# Patient Record
Sex: Female | Born: 1955 | Hispanic: No | Marital: Married | Smoking: Never smoker
Health system: Southern US, Community
[De-identification: ages and names within clinical notes are randomized; demographics above are authoritative.]

## PROBLEM LIST (undated history)

## (undated) DIAGNOSIS — I8390 Asymptomatic varicose veins of unspecified lower extremity: Secondary | ICD-10-CM

## (undated) DIAGNOSIS — R918 Other nonspecific abnormal finding of lung field: Secondary | ICD-10-CM

## (undated) DIAGNOSIS — E785 Hyperlipidemia, unspecified: Secondary | ICD-10-CM

## (undated) DIAGNOSIS — M549 Dorsalgia, unspecified: Secondary | ICD-10-CM

## (undated) DIAGNOSIS — I2510 Atherosclerotic heart disease of native coronary artery without angina pectoris: Secondary | ICD-10-CM

## (undated) HISTORY — DX: Asymptomatic varicose veins of unspecified lower extremity: I83.90

## (undated) HISTORY — PX: OTHER SURGICAL HISTORY: SHX169

## (undated) HISTORY — DX: Dorsalgia, unspecified: M54.9

## (undated) HISTORY — DX: Atherosclerotic heart disease of native coronary artery without angina pectoris: I25.10

## (undated) HISTORY — DX: Other nonspecific abnormal finding of lung field: R91.8

## (undated) HISTORY — DX: Hyperlipidemia, unspecified: E78.5

## (undated) HISTORY — PX: EYE SURGERY: SHX253

---

## 1999-06-16 ENCOUNTER — Other Ambulatory Visit: Admission: RE | Admit: 1999-06-16 | Discharge: 1999-06-16 | Payer: Self-pay | Admitting: Family Medicine

## 2004-12-22 ENCOUNTER — Other Ambulatory Visit: Admission: RE | Admit: 2004-12-22 | Discharge: 2004-12-22 | Payer: Self-pay | Admitting: Family Medicine

## 2005-12-29 ENCOUNTER — Other Ambulatory Visit: Admission: RE | Admit: 2005-12-29 | Discharge: 2005-12-29 | Payer: Self-pay | Admitting: Family Medicine

## 2007-09-07 ENCOUNTER — Other Ambulatory Visit: Admission: RE | Admit: 2007-09-07 | Discharge: 2007-09-07 | Payer: Self-pay | Admitting: Family Medicine

## 2008-01-05 ENCOUNTER — Ambulatory Visit: Payer: Self-pay | Admitting: Oncology

## 2010-09-23 ENCOUNTER — Other Ambulatory Visit: Admission: RE | Admit: 2010-09-23 | Discharge: 2010-09-23 | Payer: Self-pay | Admitting: Family Medicine

## 2015-06-06 ENCOUNTER — Other Ambulatory Visit (HOSPITAL_COMMUNITY)
Admission: RE | Admit: 2015-06-06 | Discharge: 2015-06-06 | Disposition: A | Discharge status: Home / Self Care | Payer: BLUE CROSS/BLUE SHIELD | Source: Ambulatory Visit | Attending: Physician Assistant | Admitting: Physician Assistant

## 2015-06-06 ENCOUNTER — Other Ambulatory Visit: Payer: Self-pay | Admitting: Physician Assistant

## 2015-06-06 DIAGNOSIS — Z1151 Encounter for screening for human papillomavirus (HPV): Secondary | ICD-10-CM | POA: Diagnosis present

## 2015-06-06 DIAGNOSIS — Z01419 Encounter for gynecological examination (general) (routine) without abnormal findings: Secondary | ICD-10-CM | POA: Insufficient documentation

## 2015-06-09 LAB — CYTOLOGY - PAP

## 2015-12-16 ENCOUNTER — Other Ambulatory Visit: Payer: Self-pay | Admitting: Family Medicine

## 2015-12-16 ENCOUNTER — Ambulatory Visit
Admission: RE | Admit: 2015-12-16 | Discharge: 2015-12-16 | Disposition: A | Discharge status: Home / Self Care | Payer: BLUE CROSS/BLUE SHIELD | Source: Ambulatory Visit | Attending: Family Medicine | Admitting: Family Medicine

## 2015-12-16 DIAGNOSIS — S39012A Strain of muscle, fascia and tendon of lower back, initial encounter: Secondary | ICD-10-CM

## 2016-01-08 ENCOUNTER — Other Ambulatory Visit: Payer: Self-pay | Admitting: Family Medicine

## 2016-01-08 DIAGNOSIS — N289 Disorder of kidney and ureter, unspecified: Secondary | ICD-10-CM

## 2016-01-13 ENCOUNTER — Ambulatory Visit
Admission: RE | Admit: 2016-01-13 | Discharge: 2016-01-13 | Disposition: A | Discharge status: Home / Self Care | Payer: BLUE CROSS/BLUE SHIELD | Source: Ambulatory Visit | Attending: Family Medicine | Admitting: Family Medicine

## 2016-01-13 DIAGNOSIS — N289 Disorder of kidney and ureter, unspecified: Secondary | ICD-10-CM

## 2016-06-07 DIAGNOSIS — Z0000 Encounter for general adult medical examination without abnormal findings: Secondary | ICD-10-CM | POA: Diagnosis not present

## 2016-06-07 DIAGNOSIS — E785 Hyperlipidemia, unspecified: Secondary | ICD-10-CM | POA: Diagnosis not present

## 2016-06-07 DIAGNOSIS — I8390 Asymptomatic varicose veins of unspecified lower extremity: Secondary | ICD-10-CM | POA: Diagnosis not present

## 2016-06-10 ENCOUNTER — Other Ambulatory Visit: Payer: Self-pay | Admitting: *Deleted

## 2016-06-10 ENCOUNTER — Encounter: Payer: Self-pay | Admitting: Vascular Surgery

## 2016-06-10 DIAGNOSIS — I83892 Varicose veins of left lower extremities with other complications: Secondary | ICD-10-CM

## 2016-07-16 IMAGING — CR DG LUMBAR SPINE 2-3V
3 series · 3 of 3 positions shown · non-contrast
Comparison: None.

CLINICAL DATA: Chronic lumbago, with recent exacerbation

EXAM:
LUMBAR SPINE - 2-3 VIEW

[w lumbar spine ap]
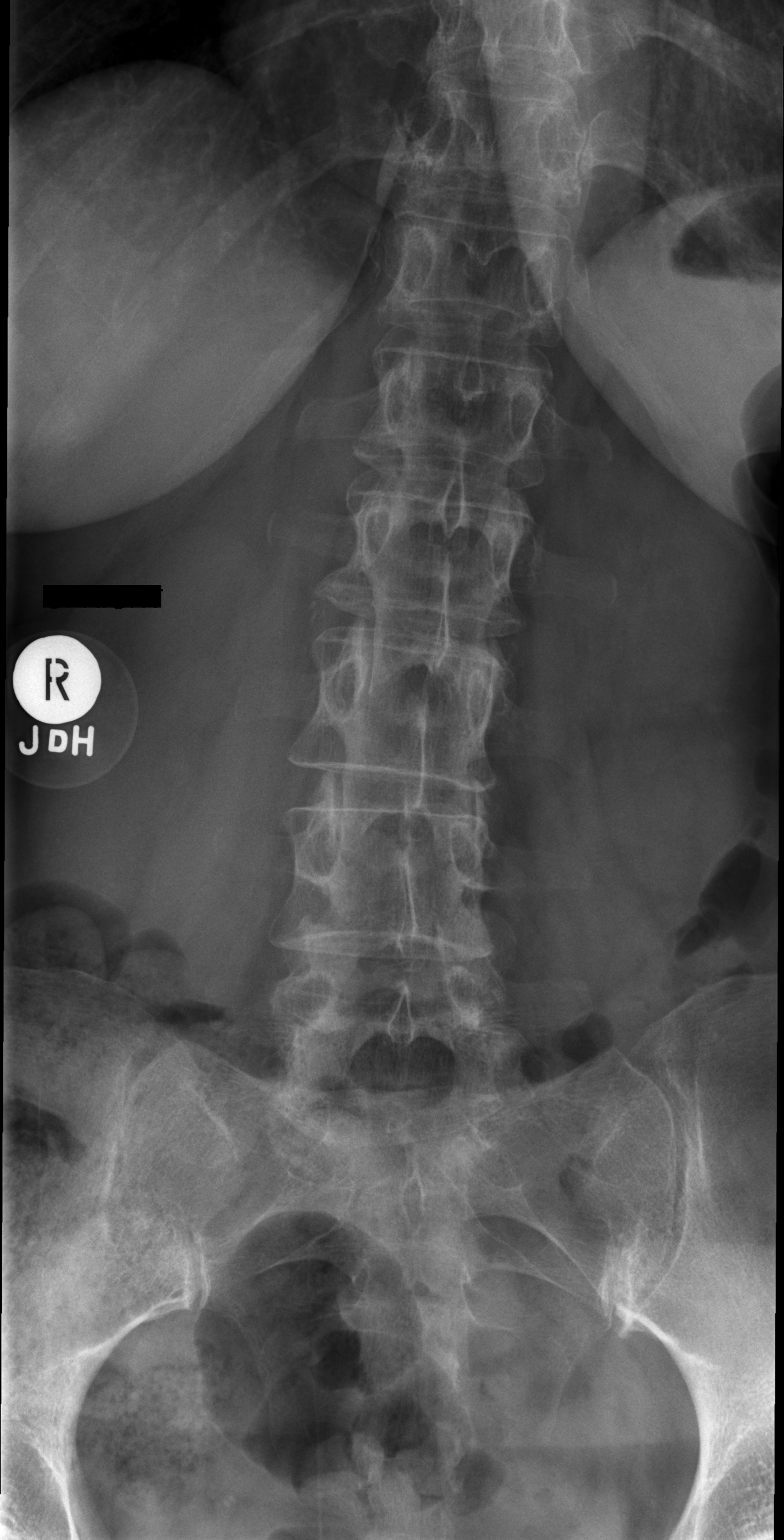

[w lumbar spine lat]
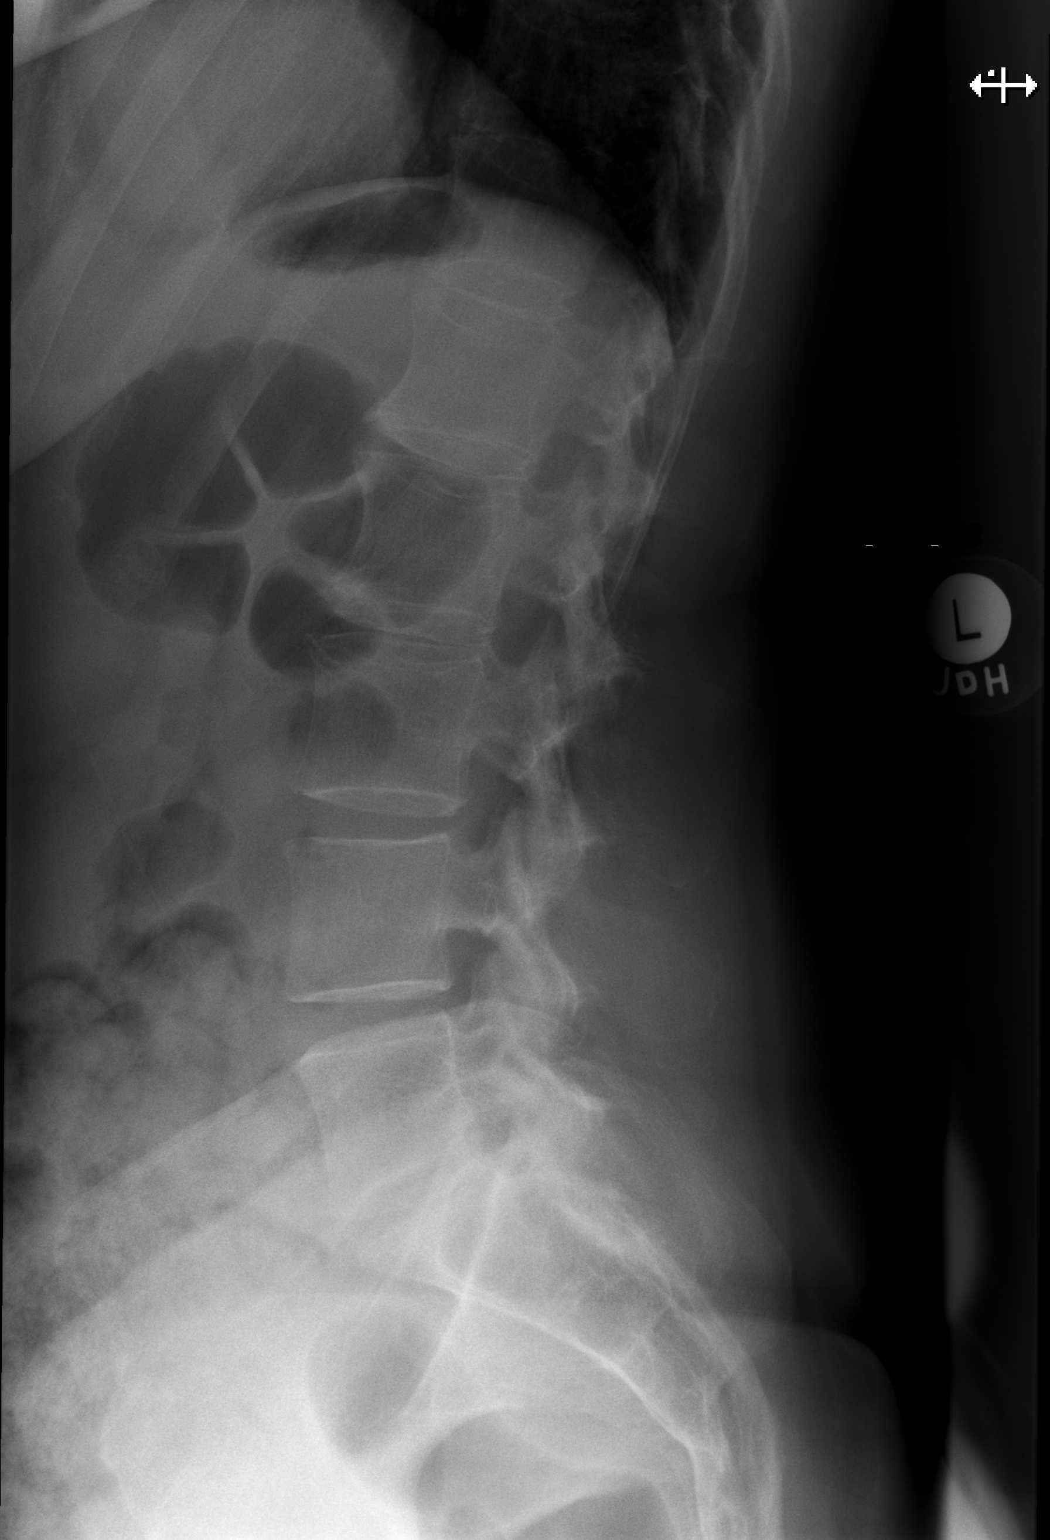

[w lumbar l-5 s-1 spot]
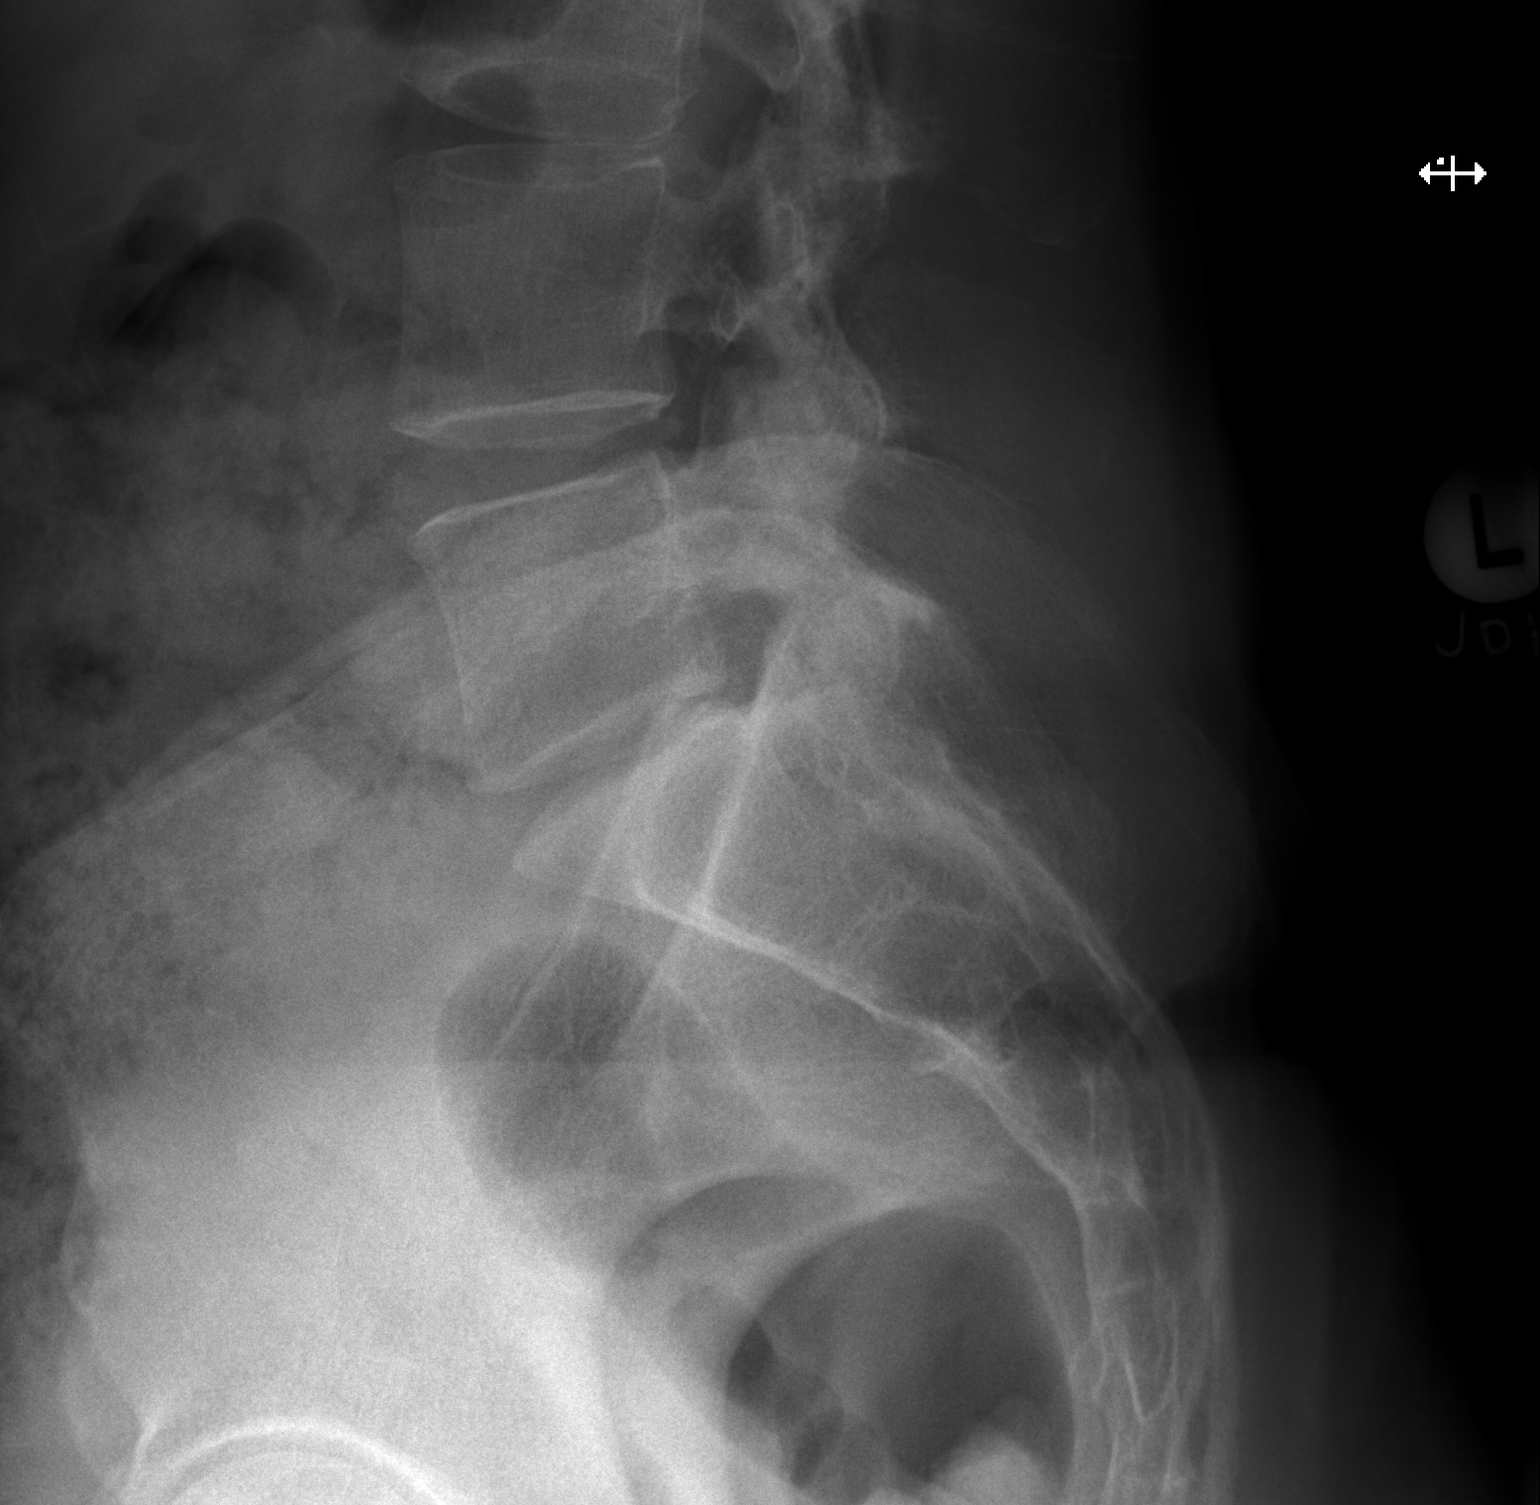

[3 of 3 positions shown; findings below may reference images not displayed]

FINDINGS: Upright frontal, upright lateral, and spot lumbosacral lateral
images were obtained. There are 5 non-rib-bearing lumbar type
vertebral bodies. There is thoracolumbar dextroscoliosis. There is
no fracture or spondylolisthesis. The disc spaces appear
unremarkable. No erosive change.
IMPRESSION: Scoliosis. No fracture or spondylolisthesis. No appreciable
arthropathic change.

## 2016-07-20 ENCOUNTER — Encounter: Payer: Self-pay | Admitting: Vascular Surgery

## 2016-07-27 ENCOUNTER — Encounter: Payer: Self-pay | Admitting: Vascular Surgery

## 2016-07-27 ENCOUNTER — Ambulatory Visit (INDEPENDENT_AMBULATORY_CARE_PROVIDER_SITE_OTHER): Payer: BLUE CROSS/BLUE SHIELD | Admitting: Vascular Surgery

## 2016-07-27 ENCOUNTER — Ambulatory Visit (HOSPITAL_COMMUNITY)
Admission: RE | Admit: 2016-07-27 | Discharge: 2016-07-27 | Disposition: A | Discharge status: Home / Self Care | Payer: BLUE CROSS/BLUE SHIELD | Source: Ambulatory Visit | Attending: Vascular Surgery | Admitting: Vascular Surgery

## 2016-07-27 VITALS — BP 136/76 | HR 54 | Temp 97.4°F | Resp 18 | Ht 66.75 in | Wt 161.3 lb

## 2016-07-27 DIAGNOSIS — I83892 Varicose veins of left lower extremities with other complications: Secondary | ICD-10-CM | POA: Diagnosis not present

## 2016-07-27 NOTE — Progress Notes (Signed)
Patient name: Rebekah PapasLinda S Schultz MRN: [id number] DOB: 11/01/1956 Sex: female  REASON FOR CONSULT: Evaluation of the venous hypertension left leg  HPI: Rebekah PapasLinda S Schultz is a 60 y.o. female, seen today for discussion of venous hypertension. 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 She has had episodes over the past several years where she's developed spontaneous bleeding under the skin in the area of a plexus of varicosities in the medial aspect of her proximal calf. She has never had any external hemorrhage. She does report mild discomfort and mild swelling associated with this. Reports that she has had varicosities for many years. She does have some mild discomfort with prolonged standing but this is tolerable. No history of DVT. Has a strong family history for arterial occlusive disease in her lower extremities was concern regarding. Did have history of varicosities in her mother.  Past Medical History:  Diagnosis Date  . Back pain   . Hyperlipidemia   . Varicose veins     Family History  Problem Relation Age of Onset  . Cancer Mother   . Diabetes Father   . Cancer Father   . Diabetes Sister   . Hyperlipidemia Sister   . Cancer Brother   . Diabetes Brother   . Hypertension Brother   . Cancer Maternal Grandmother   . Diabetes Paternal Grandmother   . Heart disease Paternal Grandmother   . Diabetes Paternal Grandfather   . Cancer Maternal Uncle     SOCIAL HISTORY: Social History   Social History  . Marital status: Married    Spouse name: N/A  . Number of children: N/A  . Years of education: N/A   Occupational History  . Not on file.   Social History Main Topics  . Smoking status: Never Smoker  . Smokeless tobacco: Not on file  . Alcohol use Yes     Comment: rare  . Drug use: No  . Sexual activity: Not on file   Other Topics Concern  . Not on file   Social History Narrative  . No narrative on file    Allergies  Allergen  Reactions  . Codeine     Severe nausea    Current Outpatient Prescriptions  Medication Sig Dispense Refill  . Calcium Carb-Cholecalciferol (CALCIUM + D3) 600-200 MG-UNIT TABS Take by mouth daily.    . Multiple Vitamin (MULTIVITAMIN) tablet Take 1 tablet by mouth daily.    . naproxen sodium (ANAPROX) 220 MG tablet Take 220 mg by mouth daily.      No current facility-administered medications for this visit.     REVIEW OF SYSTEMS:  [X]  denotes positive finding, [ ]  denotes negative finding Cardiac  Comments:  Chest pain or chest pressure:    Shortness of breath upon exertion:    Short of breath when lying flat:    Irregular heart rhythm:        Vascular    Pain in calf, thigh, or hip brought on by ambulation:    Pain in feet at night that wakes you up from your sleep:     Blood clot in your veins:    Leg swelling:         Pulmonary    Oxygen at home:    Productive cough:     Wheezing:         Neurologic    Sudden weakness in arms or legs:     Sudden numbness in arms or  legs:     Sudden onset of difficulty speaking or slurred speech:    Temporary loss of vision in one eye:     Problems with dizziness:         Gastrointestinal    Blood in stool:     Vomited blood:         Genitourinary    Burning when urinating:     Blood in urine:        Psychiatric    Major depression:         Hematologic    Bleeding problems:    Problems with blood clotting too easily:        Skin    Rashes or ulcers:        Constitutional    Fever or chills:      PHYSICAL EXAM: Vitals:   07/27/16 0953  BP: 136/76  Pulse: (!) 54  Resp: 18  Temp: 97.4 F (36.3 C)  TempSrc: Oral  SpO2: 100%  Weight: 161 lb 4.8 oz (73.2 kg)  Height: 5' 6.75" (1.695 m)    GENERAL: The patient is a well-nourished female, in no acute distress. The vital signs are documented above. CARDIAC: There is a regular rate and rhythm.  VASCULAR: 2+ radial 2+ dorsalis pedis pulses bilaterally. Carotid  arteries without bruits bilaterally PULMONARY: There is good air exchange bilaterally without wheezing or rales. ABDOMEN: Soft and non-tender with normal pitched bowel sounds.  MUSCULOSKELETAL: There are no major deformities or cyanosis. NEUROLOGIC: No focal weakness or paresthesias are detected. SKIN: There are no ulcers or rashes noted. PSYCHIATRIC: The patient has a normal affect. She does have a plexus of varicosities in the medial aspect of her left calf. Also has varicosities in her right thigh extending from the medial groin area across her anterior thigh and then down her lateral calf.  DATA:   She underwent left leg duplex today. This does show reflux in her deep and superficial system. I imaged these areas with SonoSite ultrasound as well. The reflux from the great saphenous vein does extend into these varicosities in her medial calf.  MEDICAL ISSUES:  Discuss the significance of this at length with patient. Explained that this is not pose any danger or increased risk for DVT. Explained that this may be progressive. She currently is able to tolerate the discomfort associated with these swelling in her varicosities. I she has had multiple episodes of subcutaneous hemorrhage related to the pressure in these but this is currently tolerable for her. Explained that the pattern on her left leg is more compatible with reflux and anterior accessory branch. She is having no discomfort with this. She was relieved with our discussion. I explained that if she does develop worsening pain the more concern regarding that she could contact us at any time for further evaluation and possible treatment.   Gretta Began [facility] [phone]

## 2016-08-04 ENCOUNTER — Encounter: Payer: Self-pay | Admitting: Family Medicine

## 2016-08-13 IMAGING — US US RENAL
1 series · 14 of 25 positions shown · non-contrast
Comparison: None.

CLINICAL DATA: Follow-up renal lesions seen on MRI

EXAM:
RENAL / URINARY TRACT ULTRASOUND COMPLETE

[Series 1: us renal · 0.21mm/px · 14 of 35 slices shown]
[im 1/35]
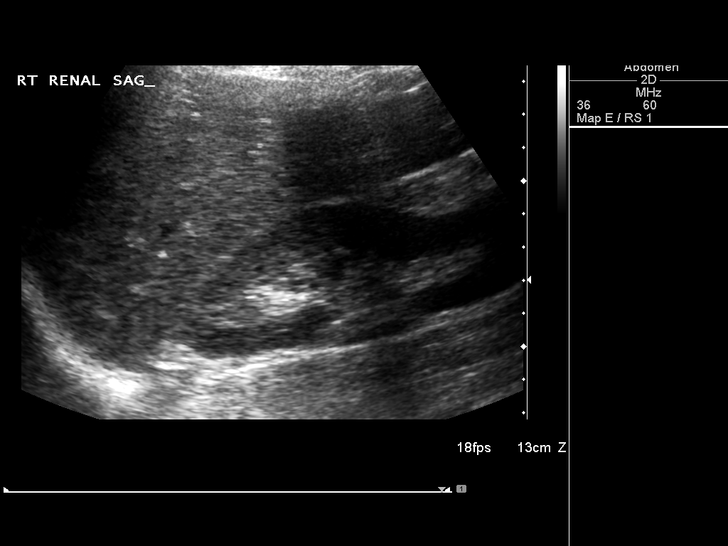
[im 3/35]
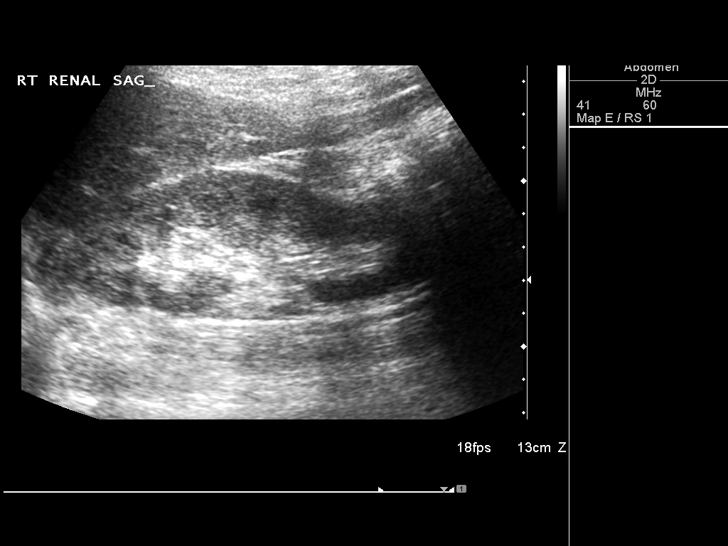
[im 6/35]
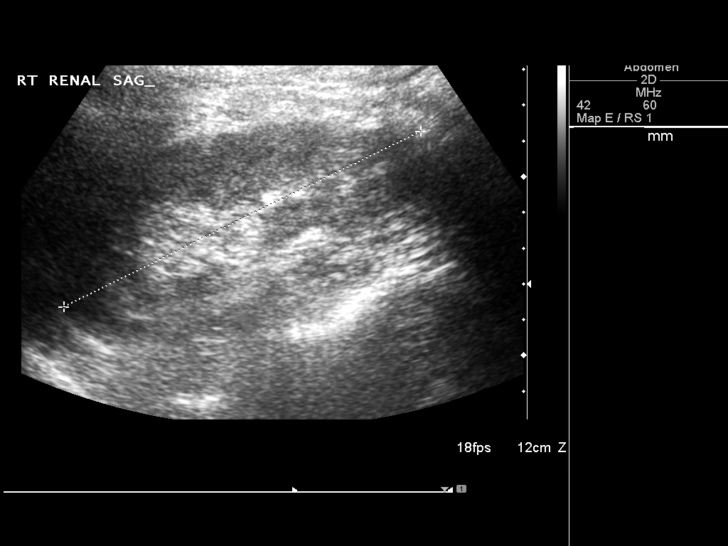
[im 9/35]
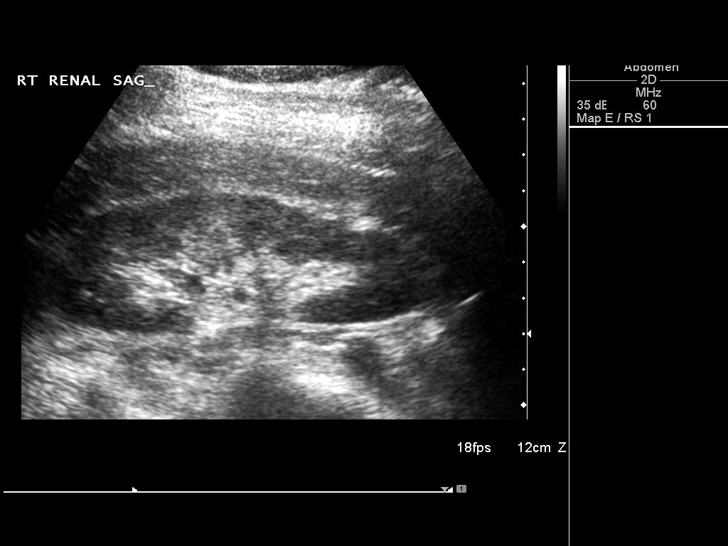
[im 12/35]
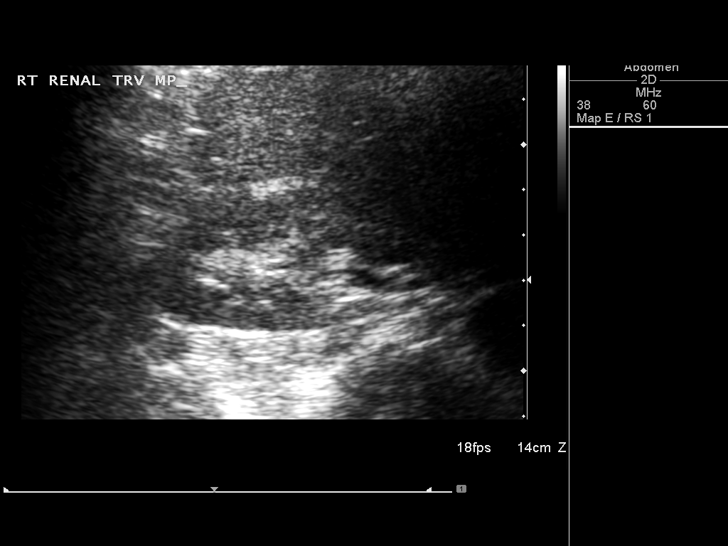
[im 13/35]
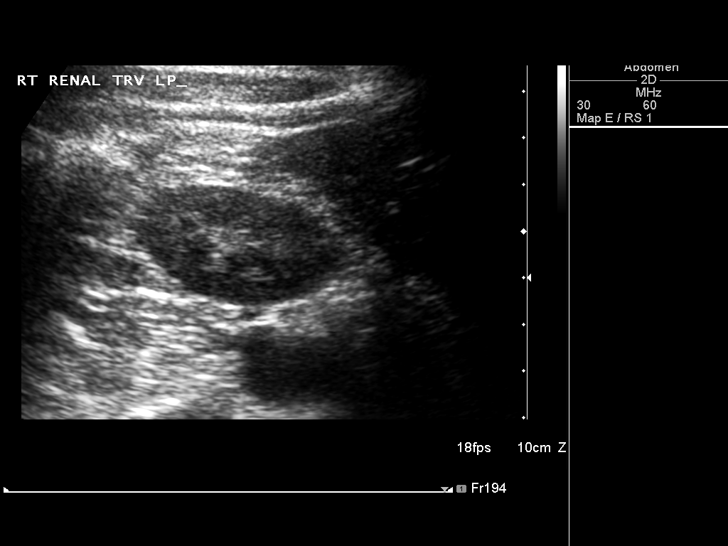
[im 16/35]
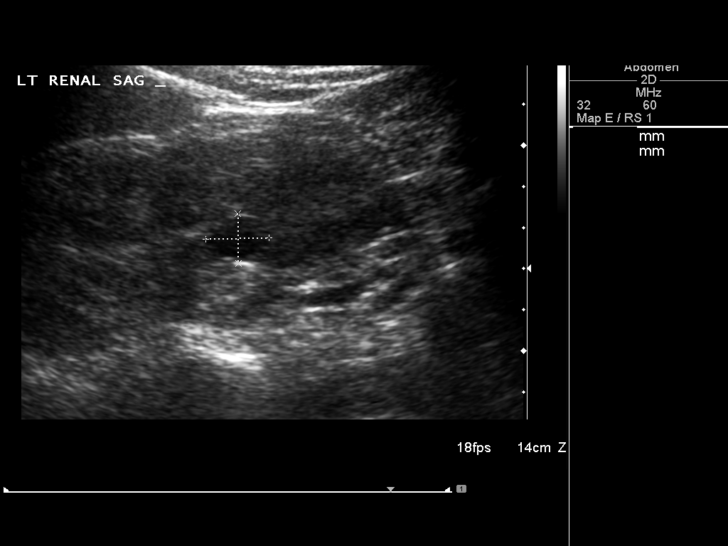
[im 19/35]
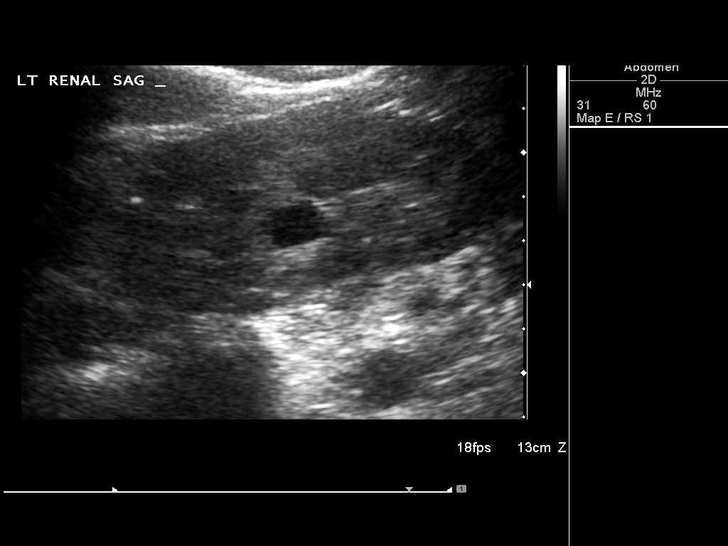
[im 22/35]
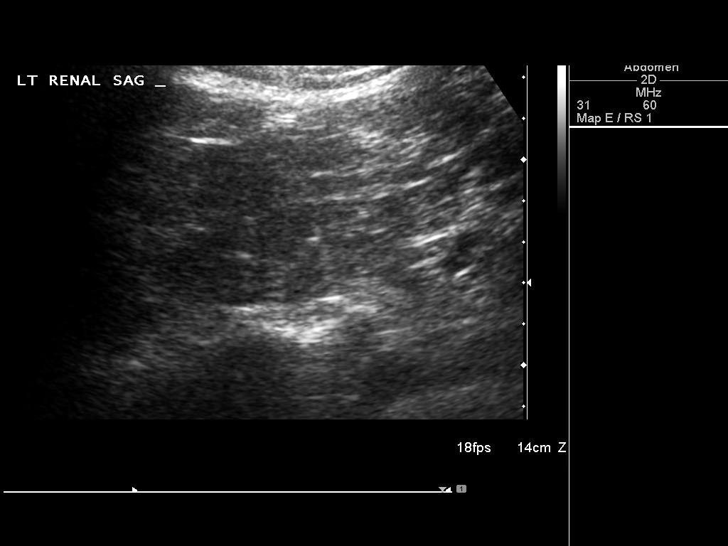
[im 23/35]
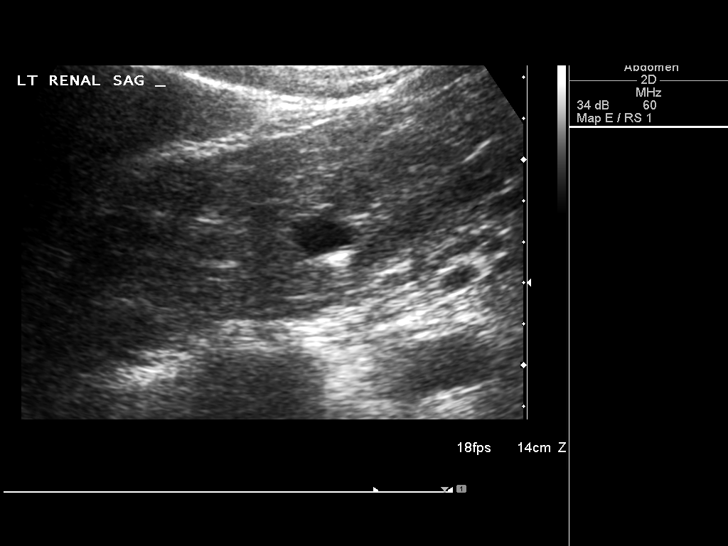
[im 26/35]
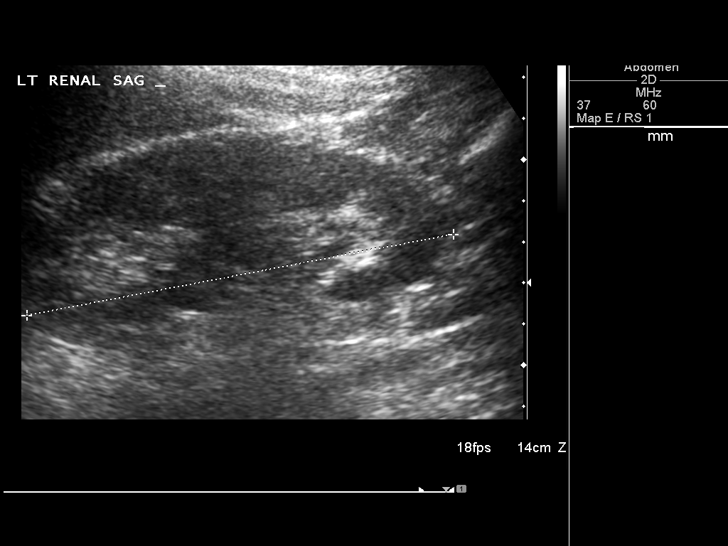
[im 29/35]
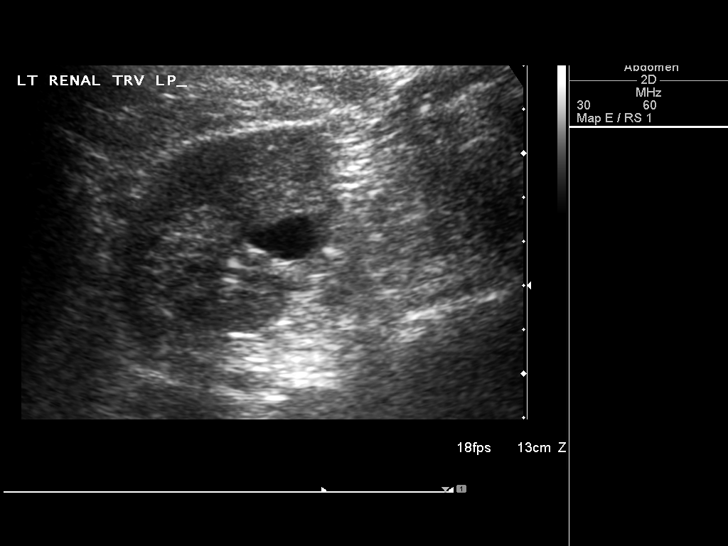
[im 32/35]
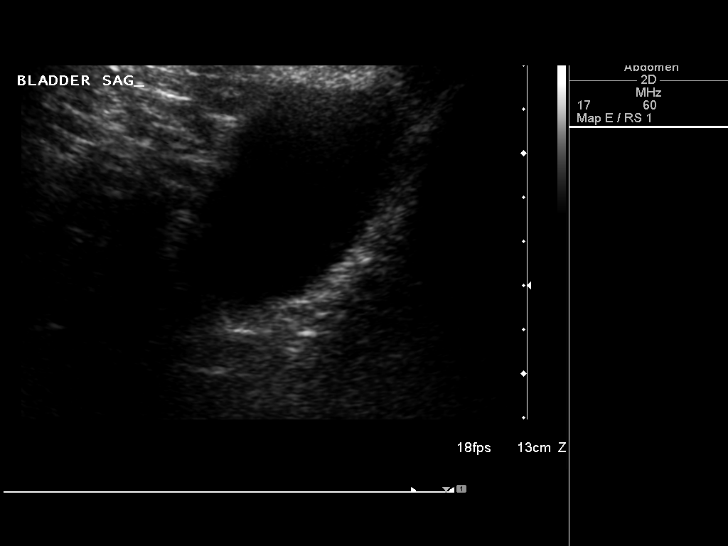
[im 35/35]
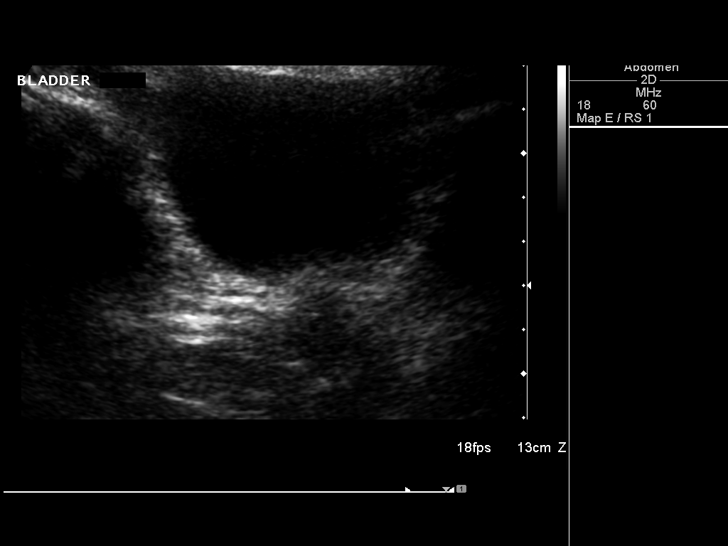

[14 of 25 positions shown; findings below may reference images not displayed]

FINDINGS: Right Kidney:

Length: 11.3 cm. Echogenicity within normal limits. No mass or
hydronephrosis visualized.

Left Kidney:

Length: 10.6 cm. Echogenicity within normal limits. No mass or
hydronephrosis visualized. Lower pole cyst measures up to 1.5 cm and
appears benign.

Bladder:

Appears normal for degree of bladder distention.
IMPRESSION: No acute findings.  No hydronephrosis.

Small benign-appearing cyst in the mid to lower pole of the left
kidney.

## 2016-09-13 DIAGNOSIS — Z1231 Encounter for screening mammogram for malignant neoplasm of breast: Secondary | ICD-10-CM | POA: Diagnosis not present

## 2016-12-03 DIAGNOSIS — H5213 Myopia, bilateral: Secondary | ICD-10-CM | POA: Diagnosis not present

## 2017-05-06 DIAGNOSIS — J069 Acute upper respiratory infection, unspecified: Secondary | ICD-10-CM | POA: Diagnosis not present

## 2017-07-26 DIAGNOSIS — E785 Hyperlipidemia, unspecified: Secondary | ICD-10-CM | POA: Diagnosis not present

## 2017-07-26 DIAGNOSIS — Z0000 Encounter for general adult medical examination without abnormal findings: Secondary | ICD-10-CM | POA: Diagnosis not present

## 2017-07-26 DIAGNOSIS — Z23 Encounter for immunization: Secondary | ICD-10-CM | POA: Diagnosis not present

## 2017-09-07 DIAGNOSIS — E2839 Other primary ovarian failure: Secondary | ICD-10-CM | POA: Diagnosis not present

## 2017-09-07 DIAGNOSIS — M8588 Other specified disorders of bone density and structure, other site: Secondary | ICD-10-CM | POA: Diagnosis not present

## 2017-09-14 DIAGNOSIS — M859 Disorder of bone density and structure, unspecified: Secondary | ICD-10-CM | POA: Diagnosis not present

## 2018-08-17 ENCOUNTER — Other Ambulatory Visit (HOSPITAL_COMMUNITY)
Admission: RE | Admit: 2018-08-17 | Discharge: 2018-08-17 | Disposition: A | Discharge status: Home / Self Care | Payer: BLUE CROSS/BLUE SHIELD | Source: Ambulatory Visit | Attending: Family Medicine | Admitting: Family Medicine

## 2018-08-17 ENCOUNTER — Other Ambulatory Visit: Payer: Self-pay | Admitting: Family Medicine

## 2018-08-17 DIAGNOSIS — E785 Hyperlipidemia, unspecified: Secondary | ICD-10-CM | POA: Diagnosis not present

## 2018-08-17 DIAGNOSIS — Z124 Encounter for screening for malignant neoplasm of cervix: Secondary | ICD-10-CM | POA: Insufficient documentation

## 2018-08-17 DIAGNOSIS — Z0000 Encounter for general adult medical examination without abnormal findings: Secondary | ICD-10-CM | POA: Diagnosis not present

## 2018-08-17 DIAGNOSIS — Z23 Encounter for immunization: Secondary | ICD-10-CM | POA: Diagnosis not present

## 2018-08-21 LAB — CYTOLOGY - PAP
DIAGNOSIS: NEGATIVE
HPV (WINDOPATH): NOT DETECTED

## 2018-09-13 DIAGNOSIS — Z01818 Encounter for other preprocedural examination: Secondary | ICD-10-CM | POA: Diagnosis not present

## 2018-09-19 DIAGNOSIS — Z1211 Encounter for screening for malignant neoplasm of colon: Secondary | ICD-10-CM | POA: Diagnosis not present

## 2018-09-19 DIAGNOSIS — K649 Unspecified hemorrhoids: Secondary | ICD-10-CM | POA: Diagnosis not present

## 2018-09-19 DIAGNOSIS — K5730 Diverticulosis of large intestine without perforation or abscess without bleeding: Secondary | ICD-10-CM | POA: Diagnosis not present

## 2018-10-05 DIAGNOSIS — J0140 Acute pansinusitis, unspecified: Secondary | ICD-10-CM | POA: Diagnosis not present

## 2018-11-18 DIAGNOSIS — R05 Cough: Secondary | ICD-10-CM | POA: Diagnosis not present

## 2019-05-23 DIAGNOSIS — H5213 Myopia, bilateral: Secondary | ICD-10-CM | POA: Diagnosis not present

## 2019-07-11 DIAGNOSIS — D485 Neoplasm of uncertain behavior of skin: Secondary | ICD-10-CM | POA: Diagnosis not present

## 2019-07-11 DIAGNOSIS — D1801 Hemangioma of skin and subcutaneous tissue: Secondary | ICD-10-CM | POA: Diagnosis not present

## 2019-07-11 DIAGNOSIS — L821 Other seborrheic keratosis: Secondary | ICD-10-CM | POA: Diagnosis not present

## 2019-07-11 DIAGNOSIS — L570 Actinic keratosis: Secondary | ICD-10-CM | POA: Diagnosis not present

## 2019-07-11 DIAGNOSIS — L578 Other skin changes due to chronic exposure to nonionizing radiation: Secondary | ICD-10-CM | POA: Diagnosis not present

## 2019-12-11 DIAGNOSIS — Z20828 Contact with and (suspected) exposure to other viral communicable diseases: Secondary | ICD-10-CM | POA: Diagnosis not present

## 2020-01-23 DIAGNOSIS — Z1231 Encounter for screening mammogram for malignant neoplasm of breast: Secondary | ICD-10-CM | POA: Diagnosis not present

## 2020-03-25 DIAGNOSIS — Z0000 Encounter for general adult medical examination without abnormal findings: Secondary | ICD-10-CM | POA: Diagnosis not present

## 2020-03-25 DIAGNOSIS — Z23 Encounter for immunization: Secondary | ICD-10-CM | POA: Diagnosis not present

## 2020-03-25 DIAGNOSIS — E785 Hyperlipidemia, unspecified: Secondary | ICD-10-CM | POA: Diagnosis not present

## 2020-04-22 DIAGNOSIS — H5213 Myopia, bilateral: Secondary | ICD-10-CM | POA: Diagnosis not present

## 2020-11-19 DIAGNOSIS — Z20822 Contact with and (suspected) exposure to covid-19: Secondary | ICD-10-CM | POA: Diagnosis not present

## 2022-01-25 DIAGNOSIS — J300 Vasomotor rhinitis: Secondary | ICD-10-CM | POA: Diagnosis not present

## 2022-01-25 DIAGNOSIS — R059 Cough, unspecified: Secondary | ICD-10-CM | POA: Diagnosis not present

## 2022-01-25 DIAGNOSIS — H1045 Other chronic allergic conjunctivitis: Secondary | ICD-10-CM | POA: Diagnosis not present

## 2022-05-18 DIAGNOSIS — Z1331 Encounter for screening for depression: Secondary | ICD-10-CM | POA: Diagnosis not present

## 2022-05-18 DIAGNOSIS — E785 Hyperlipidemia, unspecified: Secondary | ICD-10-CM | POA: Diagnosis not present

## 2022-05-18 DIAGNOSIS — Z0000 Encounter for general adult medical examination without abnormal findings: Secondary | ICD-10-CM | POA: Diagnosis not present

## 2022-05-18 DIAGNOSIS — Z23 Encounter for immunization: Secondary | ICD-10-CM | POA: Diagnosis not present

## 2022-08-10 DIAGNOSIS — H2513 Age-related nuclear cataract, bilateral: Secondary | ICD-10-CM | POA: Diagnosis not present

## 2022-09-29 DIAGNOSIS — J300 Vasomotor rhinitis: Secondary | ICD-10-CM | POA: Diagnosis not present

## 2022-09-29 DIAGNOSIS — R058 Other specified cough: Secondary | ICD-10-CM | POA: Diagnosis not present

## 2022-09-29 DIAGNOSIS — H1045 Other chronic allergic conjunctivitis: Secondary | ICD-10-CM | POA: Diagnosis not present

## 2022-12-01 DIAGNOSIS — Z1231 Encounter for screening mammogram for malignant neoplasm of breast: Secondary | ICD-10-CM | POA: Diagnosis not present

## 2022-12-08 DIAGNOSIS — R922 Inconclusive mammogram: Secondary | ICD-10-CM | POA: Diagnosis not present

## 2023-06-14 DIAGNOSIS — Z0000 Encounter for general adult medical examination without abnormal findings: Secondary | ICD-10-CM | POA: Diagnosis not present

## 2023-06-14 DIAGNOSIS — Z124 Encounter for screening for malignant neoplasm of cervix: Secondary | ICD-10-CM | POA: Diagnosis not present

## 2023-06-14 DIAGNOSIS — Z1331 Encounter for screening for depression: Secondary | ICD-10-CM | POA: Diagnosis not present

## 2023-06-14 DIAGNOSIS — E785 Hyperlipidemia, unspecified: Secondary | ICD-10-CM | POA: Diagnosis not present

## 2023-08-08 DIAGNOSIS — H1045 Other chronic allergic conjunctivitis: Secondary | ICD-10-CM | POA: Diagnosis not present

## 2023-08-08 DIAGNOSIS — R058 Other specified cough: Secondary | ICD-10-CM | POA: Diagnosis not present

## 2023-08-08 DIAGNOSIS — J300 Vasomotor rhinitis: Secondary | ICD-10-CM | POA: Diagnosis not present

## 2023-08-25 DIAGNOSIS — Z23 Encounter for immunization: Secondary | ICD-10-CM | POA: Diagnosis not present

## 2023-08-25 DIAGNOSIS — J300 Vasomotor rhinitis: Secondary | ICD-10-CM | POA: Diagnosis not present

## 2023-08-25 DIAGNOSIS — Z6824 Body mass index (BMI) 24.0-24.9, adult: Secondary | ICD-10-CM | POA: Diagnosis not present

## 2023-10-03 DIAGNOSIS — H2511 Age-related nuclear cataract, right eye: Secondary | ICD-10-CM | POA: Diagnosis not present

## 2023-11-21 DIAGNOSIS — N390 Urinary tract infection, site not specified: Secondary | ICD-10-CM | POA: Diagnosis not present

## 2023-11-22 ENCOUNTER — Other Ambulatory Visit: Payer: Self-pay | Admitting: Nurse Practitioner

## 2023-11-22 ENCOUNTER — Ambulatory Visit
Admission: RE | Admit: 2023-11-22 | Discharge: 2023-11-22 | Disposition: A | Discharge status: Home / Self Care | Payer: Medicare Other | Source: Ambulatory Visit | Attending: Nurse Practitioner | Admitting: Nurse Practitioner

## 2023-11-22 DIAGNOSIS — R93422 Abnormal radiologic findings on diagnostic imaging of left kidney: Secondary | ICD-10-CM | POA: Diagnosis not present

## 2023-11-22 DIAGNOSIS — R109 Unspecified abdominal pain: Secondary | ICD-10-CM

## 2023-11-22 DIAGNOSIS — Z1389 Encounter for screening for other disorder: Secondary | ICD-10-CM | POA: Diagnosis not present

## 2023-11-22 DIAGNOSIS — K5790 Diverticulosis of intestine, part unspecified, without perforation or abscess without bleeding: Secondary | ICD-10-CM | POA: Diagnosis not present

## 2023-11-22 DIAGNOSIS — K5730 Diverticulosis of large intestine without perforation or abscess without bleeding: Secondary | ICD-10-CM | POA: Diagnosis not present

## 2023-11-28 ENCOUNTER — Other Ambulatory Visit: Payer: Self-pay | Admitting: Nurse Practitioner

## 2023-11-28 DIAGNOSIS — R918 Other nonspecific abnormal finding of lung field: Secondary | ICD-10-CM

## 2023-12-01 ENCOUNTER — Ambulatory Visit
Admission: RE | Admit: 2023-12-01 | Discharge: 2023-12-01 | Disposition: A | Discharge status: Home / Self Care | Payer: Medicare Other | Source: Ambulatory Visit | Attending: Nurse Practitioner | Admitting: Nurse Practitioner

## 2023-12-01 DIAGNOSIS — R918 Other nonspecific abnormal finding of lung field: Secondary | ICD-10-CM

## 2023-12-05 DIAGNOSIS — N130 Hydronephrosis with ureteropelvic junction obstruction: Secondary | ICD-10-CM | POA: Diagnosis not present

## 2023-12-05 DIAGNOSIS — R3121 Asymptomatic microscopic hematuria: Secondary | ICD-10-CM | POA: Diagnosis not present

## 2023-12-07 DIAGNOSIS — Z1231 Encounter for screening mammogram for malignant neoplasm of breast: Secondary | ICD-10-CM | POA: Diagnosis not present

## 2024-01-03 DIAGNOSIS — N130 Hydronephrosis with ureteropelvic junction obstruction: Secondary | ICD-10-CM | POA: Diagnosis not present

## 2024-01-03 DIAGNOSIS — K5730 Diverticulosis of large intestine without perforation or abscess without bleeding: Secondary | ICD-10-CM | POA: Diagnosis not present

## 2024-01-03 DIAGNOSIS — N2889 Other specified disorders of kidney and ureter: Secondary | ICD-10-CM | POA: Diagnosis not present

## 2024-01-03 DIAGNOSIS — N281 Cyst of kidney, acquired: Secondary | ICD-10-CM | POA: Diagnosis not present

## 2024-01-03 DIAGNOSIS — R3121 Asymptomatic microscopic hematuria: Secondary | ICD-10-CM | POA: Diagnosis not present

## 2024-01-03 DIAGNOSIS — N200 Calculus of kidney: Secondary | ICD-10-CM | POA: Diagnosis not present

## 2024-02-07 DIAGNOSIS — J069 Acute upper respiratory infection, unspecified: Secondary | ICD-10-CM | POA: Diagnosis not present

## 2024-02-08 DIAGNOSIS — D225 Melanocytic nevi of trunk: Secondary | ICD-10-CM | POA: Diagnosis not present

## 2024-02-08 DIAGNOSIS — L578 Other skin changes due to chronic exposure to nonionizing radiation: Secondary | ICD-10-CM | POA: Diagnosis not present

## 2024-02-08 DIAGNOSIS — B079 Viral wart, unspecified: Secondary | ICD-10-CM | POA: Diagnosis not present

## 2024-02-08 DIAGNOSIS — L821 Other seborrheic keratosis: Secondary | ICD-10-CM | POA: Diagnosis not present

## 2024-02-14 ENCOUNTER — Ambulatory Visit: Payer: Medicare Other | Admitting: Acute Care

## 2024-02-14 ENCOUNTER — Encounter: Payer: Self-pay | Admitting: Acute Care

## 2024-02-14 VITALS — BP 126/78 | HR 66 | Ht 67.0 in | Wt 158.2 lb

## 2024-02-14 DIAGNOSIS — R053 Chronic cough: Secondary | ICD-10-CM

## 2024-02-14 DIAGNOSIS — I2510 Atherosclerotic heart disease of native coronary artery without angina pectoris: Secondary | ICD-10-CM | POA: Diagnosis not present

## 2024-02-14 DIAGNOSIS — R911 Solitary pulmonary nodule: Secondary | ICD-10-CM

## 2024-02-14 DIAGNOSIS — R918 Other nonspecific abnormal finding of lung field: Secondary | ICD-10-CM

## 2024-02-14 LAB — NITRIC OXIDE: Nitric Oxide: 11

## 2024-02-14 MED ORDER — BENZONATATE 100 MG PO CAPS: 100.0000 mg | ORAL_CAPSULE | Freq: Three times a day (TID) | ORAL | 0 refills | Status: DC | PRN

## 2024-02-14 NOTE — Progress Notes (Signed)
 History of Present Illness Rebekah Schultz is a 68 y.o. female never referred to Dr. Delton Coombes for evaluation of multiple pulmonary lung nodules noted on a  Ct done to evaluate her kidneys.    02/14/2024 Rebekah Schultz is a 68 year old female with recurrent pneumonia and lung nodules who presents with a persistent cough. She is accompanied by her  husband .She was referred by her primary care physician for evaluation of lung nodules found incidentally during a kidney scan.  She has experienced a persistent cough for over two months, which began following a cold that progressed to pneumonia. The cough is productive, initially with green sputum, now clearer but still thick, and is most productive in the morning. Despite a course of prednisone, the cough persists.  She has a history of recurrent pneumonia, occurring four to five times over the past twenty years, with increased frequency recently. A recent CT scan revealed lung nodules, scarring in the lung bases, and low-level paraseptal emphysema, but no interstitial lung disease.  She has a history of allergies and sinus issues, previously managed with montelukast and levocetirizine. She discontinued montelukast due to concerns about memory loss, with no significant change in symptoms. A recent trip to [place] exacerbated her sinus issues, leading to a course of prednisone.  Her past medical history includes exposure to secondhand smoke from her father during childhood and a history of sanding and painting furniture, which may have contributed to her respiratory issues. She denies any personal history of smoking.  She has a family history of heart disease, affecting her father and siblings, and has been diagnosed with coronary artery disease. She has not experienced chest pain and is not currently seeing a cardiologist.I will refer her to cardiology for evaluation.   Test Results: 12/01/2023 HRCT Chest Innumerable tiny ground-glass nodules  diffusely throughout the lungs although most concentrated in the lung bases. These are nonspecific and infectious or inflammatory. Although such nodules are most commonly seen in smoking-related respiratory bronchiolitis and other inhalational inflammatory lung disease such as hypersensitivity pneumonitis or occupational pneumoconiosis, bibasilar predominant distribution is not typical. 2. Mild, bland appearing bandlike scarring in the lung bases. No evidence of fibrotic interstitial lung disease. 3. Suspect minimal paraseptal emphysema. 4. Coronary artery disease.     No data to display              No data to display          BNP No results found for: "BNP"  ProBNP No results found for: "PROBNP"  PFT No results found for: "FEV1PRE", "FEV1POST", "FVCPRE", "FVCPOST", "TLC", "DLCOUNC", "PREFEV1FVCRT", "PSTFEV1FVCRT"  No results found.   Past medical hx Past Medical History:  Diagnosis Date   Back pain    Hyperlipidemia    Varicose veins      Social History   Tobacco Use   Smoking status: Never    Passive exposure: Past  Substance Use Topics   Alcohol use: Yes    Comment: rare   Drug use: No    Ms.Wendt reports that she has never smoked. She has been exposed to tobacco smoke. She does not have any smokeless tobacco history on file. She reports current alcohol use. She reports that she does not use drugs.  Tobacco Cessation:  Never smoker    Past surgical hx, Family hx, Social hx all reviewed.  Current Outpatient Medications on File Prior to Visit  Medication Sig   albuterol (VENTOLIN HFA) 108 (90 Base) MCG/ACT inhaler Inhale  1-2 puffs into the lungs every 6 (six) hours as needed for wheezing or shortness of breath.   Calcium Carb-Cholecalciferol (CALCIUM + D3) 600-200 MG-UNIT TABS Take by mouth daily.   fluticasone (FLONASE) 50 MCG/ACT nasal spray Place 1 spray into both nostrils daily.   levocetirizine (XYZAL) 5 MG tablet Take 5 mg by mouth every  evening.   montelukast (SINGULAIR) 10 MG tablet Take 10 mg by mouth at bedtime.   Multiple Vitamin (MULTIVITAMIN) tablet Take 1 tablet by mouth daily.   naproxen sodium (ANAPROX) 220 MG tablet Take 220 mg by mouth daily.    No current facility-administered medications on file prior to visit.     Allergies  Allergen Reactions   Codeine     Severe nausea    Review Of Systems:  Constitutional:   No  weight loss, night sweats,  Fevers, chills, fatigue, or  lassitude.  HEENT:   No headaches,  Difficulty swallowing,  Tooth/dental problems, or  Sore throat,                No sneezing, itching, ear ache, nasal congestion, post nasal drip,   CV:  No chest pain,  Orthopnea, PND, swelling in lower extremities, anasarca, dizziness, palpitations, syncope.   GI  No heartburn, indigestion, abdominal pain, nausea, vomiting, diarrhea, change in bowel habits, loss of appetite, bloody stools.   Resp: No shortness of breath with exertion or at rest.  No excess mucus, no productive cough,  No non-productive cough,  No coughing up of blood.  No change in color of mucus.  No wheezing.  No chest wall deformity  Skin: no rash or lesions.  GU: no dysuria, change in color of urine, no urgency or frequency.  No flank pain, no hematuria   MS:  No joint pain or swelling.  No decreased range of motion.  No back pain.  Psych:  No change in mood or affect. No depression or anxiety.  No memory loss.   Vital Signs BP 126/78 (BP Location: Left Arm, Patient Position: Sitting, Cuff Size: Normal)   Pulse 66   Ht 5\' 7"  (1.702 m)   Wt 158 lb 3.2 oz (71.8 kg)   SpO2 96%   BMI 24.78 kg/m    Physical Exam:  General- No distress,  A&Ox3 ENT: No sinus tenderness, TM clear, pale nasal mucosa, no oral exudate,no post nasal drip, no LAN Cardiac: S1, S2, regular rate and rhythm, no murmur Chest: No wheeze/ rales/ dullness; no accessory muscle use, no nasal flaring, no sternal retractions Abd.: Soft  Non-tender Ext: No clubbing cyanosis, edema Neuro:  normal strength Skin: No rashes, warm and dry Psych: normal mood and behavior   Assessment/Plan Lung Nodules Innumerable tiny ground glass nodules diffusely scattered in the lungs, likely infectious or inflammatory. Differential includes hypersensitivity pneumonitis, occupational pneumoconiosis, or other inhalational inflammatory diseases. No evidence of interstitial lung disease. Sputum culture essential for colonized infection. - Send home with sterile containers for sputum collection for culture. - Order hypersensitivity pneumonitis panel. - Order sputum culture and sputum AFB. - Order autoimmune panel including ACE, ANA, RA, sed rate. - Order CBC with differential and IgE with RAST panel. - Perform FeNO test to assess airway inflammation.>> 11 PPB - Follow up in 3 weeks to review results - Consider swallow eval at follow up - Consider PFT's at follow up.   Chronic Cough Persistent cough over two months post-cold, with initial green sputum now clearer but thick. Possible association with lung nodules and  underlying inflammatory or infectious process. Sips of water instead of throat clearing Sugar Free Coca-Cola or Werther's originals for throat soothing. Delsym Cough syrup 5 cc's every 12 hours Non-sedating antihistamine of your choice daily ( Zyrtec, Allegra, Xyzol, Claritin ( Generic ok)  -Tessalon Perles TID prn cough - You can consider trial of protonix OTC to see if this helps.   Coronary Artery Disease Incidental coronary artery calcifications and scattered aortic atherosclerosis on CT scan. Family history of heart disease. Previous calcium score of 8, 54th percentile for demographic. - Refer to cardiology for evaluation. - Consider echocardiogram to assess cardiac function.  I spent 45 minutes dedicated to the care of this patient on the date of this encounter to include pre-visit review of records, face-to-face  time with the patient discussing conditions above, post visit ordering of testing, clinical documentation with the electronic health record, making appropriate referrals as documented, and communicating necessary information to the patient's healthcare team.   Bevelyn Ngo, NP 02/14/2024  2:52 PM

## 2024-02-14 NOTE — Patient Instructions (Addendum)
 It is good to see you today. We will do some labs today. Follow up in 3 weeks to review results. Sputum for Culture, Fungus and AFB. We will give you directions on collecting these. FENO was normal, which is good news. I have sent in a prescription for Tessalon Perles. Take up to three times daily as needed.  Sips of water instead of throat clearing Sugar Free Coca-Cola or Werther's originals for throat soothing. Delsym Cough syrup 5 cc's every 12 hours You can consider trial of protonix OTC to see if this helps. Follow up in 3 weeks to review results. Please contact office for sooner follow up if symptoms do not improve or worsen or seek emergency care

## 2024-02-16 ENCOUNTER — Other Ambulatory Visit (INDEPENDENT_AMBULATORY_CARE_PROVIDER_SITE_OTHER)

## 2024-02-16 DIAGNOSIS — R053 Chronic cough: Secondary | ICD-10-CM

## 2024-02-16 LAB — ANGIOTENSIN CONVERTING ENZYME: Angiotensin-Converting Enzyme: 22 U/L (ref 9–67)

## 2024-02-16 LAB — CBC WITH DIFFERENTIAL/PLATELET
Basophils Absolute: 0 K/uL (ref 0.0–0.1)
Basophils Relative: 0.5 % (ref 0.0–3.0)
Eosinophils Absolute: 0 K/uL (ref 0.0–0.7)
Eosinophils Relative: 0.9 % (ref 0.0–5.0)
HCT: 42.2 % (ref 36.0–46.0)
Hemoglobin: 14 g/dL (ref 12.0–15.0)
Lymphocytes Relative: 36.5 % (ref 12.0–46.0)
Lymphs Abs: 1.9 K/uL (ref 0.7–4.0)
MCHC: 33.1 g/dL (ref 30.0–36.0)
MCV: 91.1 fl (ref 78.0–100.0)
Monocytes Absolute: 0.4 K/uL (ref 0.1–1.0)
Monocytes Relative: 7.4 % (ref 3.0–12.0)
Neutro Abs: 2.9 K/uL (ref 1.4–7.7)
Neutrophils Relative %: 54.7 % (ref 43.0–77.0)
Platelets: 257 K/uL (ref 150.0–400.0)
RBC: 4.63 Mil/uL (ref 3.87–5.11)
RDW: 14.4 % (ref 11.5–15.5)
WBC: 5.3 K/uL (ref 4.0–10.5)

## 2024-02-16 LAB — ANTI-NUCLEAR AB-TITER (ANA TITER): ANA Titer 1: 1:40 {titer} — ABNORMAL HIGH

## 2024-02-16 LAB — SEDIMENTATION RATE: Sed Rate: 46 mm/h — ABNORMAL HIGH (ref 0–30)

## 2024-02-16 LAB — ANA: Anti Nuclear Antibody (ANA): POSITIVE — AB

## 2024-02-16 LAB — RHEUMATOID FACTOR: Rheumatoid fact SerPl-aCnc: 20 [IU]/mL — ABNORMAL HIGH (ref <14)

## 2024-02-21 LAB — ABPA PROFILE I
Aspergillus Fumigatus IgE: <0.1 kU/L
Aspergillus fumigatus IgG: 48.5 mg/L (ref 0.0–66.5)
IgE (Immunoglobulin E), Serum: 5 [IU]/mL — ABNORMAL LOW (ref 6–495)

## 2024-02-21 LAB — ALLERGEN PROFILE, PERENNIAL ALLERGEN IGE
Alternaria Alternata IgE: <0.1 kU/L
Aureobasidi Pullulans IgE: <0.1 kU/L
Candida Albicans IgE: <0.1 kU/L
Cat Dander IgE: <0.1 kU/L
Chicken Feathers IgE: <0.1 kU/L
Cladosporium Herbarum IgE: <0.1 kU/L
Cow Dander IgE: <0.1 kU/L
D Farinae IgE: <0.1 kU/L
D Pteronyssinus IgE: <0.1 kU/L
Dog Dander IgE: <0.1 kU/L
Duck Feathers IgE: <0.1 kU/L
Goose Feathers IgE: <0.1 kU/L
Mouse Urine IgE: <0.1 kU/L
Mucor Racemosus IgE: <0.1 kU/L
Penicillium Chrysogen IgE: <0.1 kU/L
Phoma Betae IgE: <0.1 kU/L
Setomelanomma Rostrat: <0.1 kU/L
Stemphylium Herbarum IgE: <0.1 kU/L

## 2024-02-21 LAB — HYPERSENSITIVITY PNEUMONITIS
A. Pullulans Abs: NEGATIVE
A.Fumigatus #1 Abs: NEGATIVE
Micropolyspora faeni, IgG: NEGATIVE
Pigeon Serum Abs: NEGATIVE
Thermoact. Saccharii: NEGATIVE
Thermoactinomyces vulgaris, IgG: NEGATIVE

## 2024-03-06 ENCOUNTER — Encounter: Payer: Self-pay | Admitting: Acute Care

## 2024-03-06 ENCOUNTER — Ambulatory Visit: Admitting: Acute Care

## 2024-03-06 VITALS — BP 125/63 | HR 59 | Ht 66.0 in | Wt 161.8 lb

## 2024-03-06 DIAGNOSIS — J439 Emphysema, unspecified: Secondary | ICD-10-CM | POA: Diagnosis not present

## 2024-03-06 DIAGNOSIS — R052 Subacute cough: Secondary | ICD-10-CM

## 2024-03-06 DIAGNOSIS — R918 Other nonspecific abnormal finding of lung field: Secondary | ICD-10-CM | POA: Diagnosis not present

## 2024-03-06 NOTE — Patient Instructions (Addendum)
 It is good to see you today. I am glad you feel better!! We will do a CT Chest in June to re-evaluate your lung nodules. Your labs were + for some inflammatory markers.  We can consider referral to rheumatology if you continue to have issues with upper respiratory infections/ cough. I will give you copies of the labs. Please use the home Alpha 1 ID test to see if you have deficiency of Alpha 1.  This may explain the emphysema we see on your CXR.  We will consider PFT's and swallow eval after we review the CT chest and Alpha 1  Follow up after CT Chest to review results. Please contact office for sooner follow up if symptoms do not improve or worsen or seek emergency care

## 2024-03-06 NOTE — Progress Notes (Signed)
 History of Present Illness Rebekah Schultz is a 68 y.o. female never smoker referred to Rebekah Schultz for evaluation of multiple pulmonary lung nodules and recurrent pneumonia  noted on a Ct done to evaluate her kidneys 11/22/2023.   Synopsis 68 year old female with recurrent pneumonia and lung nodules who presents with a persistent cough. She is accompanied by her  husband .She was referred by her primary care physician for evaluation of lung nodules found incidentally during a kidney scan.   She has experienced a persistent cough for over two months, which began following a cold that progressed to pneumonia. The cough is productive, initially with green sputum, now clearer but still thick, and is most productive in the morning. Despite a course of prednisone, the cough persists.   She has a history of recurrent pneumonia, occurring four to five times over the past twenty years, with increased frequency recently. A recent CT scan revealed lung nodules, scarring in the lung bases, and low-level paraseptal emphysema, but no interstitial lung disease.   She has a history of allergies and sinus issues, previously managed with montelukast and levocetirizine. She discontinued montelukast due to concerns about memory loss, with no significant change in symptoms. A recent trip to [place]  exacerbated her sinus issues, leading to a course of prednisone.   Her past medical history includes exposure to secondhand smoke from her father during childhood and a history of sanding and painting furniture, which may have contributed to her respiratory issues. She denies any personal history of smoking.  03/06/2024  Pt. Presents for follow up to review labs as part of work up to determine etiology of lung nodules and frequent upper respiratory infections and chronic cough over the last 2 months( post viral illness) . She has subsequently been feeling better sine her last visit 02/14/2024. Rebekah Schultz  We did multiple labs to  evaluate for autoimmune disease. She has several inflammatory markers that were slightly positive. We discussed sending her for referral to rheumatology to have this further evaluated if she has continued issues with upper respiratory issues. She was unable to produce sputum for culture.   There was notation of emphysema on her CT Chest, which is unusual ina never smoker. I have provided her with a home Alpha 1 ID test to evaluate if she is Alpha 1 deficient. We will also evaluate PFT's after her repeat CT Chest to re-evaluate the pulmonary nodules noted on the renal scan.She will follow up after the Ct /chest has been completed.    Test Results: Labs 3.25.2025 Rheumatoid factor 20  Hypersensitivity pneumonitis panel  A.Fumigatus #1 Abs Negative  Micropolyspora faeni, IgG Negative  Thermoactinomyces vulgaris, IgG Negative  A. Pullulans Abs Negative  Thermoact. Saccharii Negative  Pigeon Serum Abs Negative  ACE 22 Sed Rate 46 ANA Positive ANA 1:40, Nuclear , Homogeneous pattern ANA pattern 1 Component Ref Range & Units (hover) 3 wk ago  D Pteronyssinus IgE <0.10  D Farinae IgE <0.10  Cat Dander IgE <0.10  Dog Dander IgE <0.10  Cow Dander IgE <0.10  Goose Feathers IgE <0.10  Chicken Feathers IgE <0.10  Duck Feathers IgE <0.10  Penicillium Chrysogen IgE <0.10  Cladosporium Herbarum IgE <0.10  Mucor Racemosus IgE <0.10  Candida Albicans IgE <0.10  Alternaria Alternata IgE <0.10  Setomelanomma Rostrat <0.10  Aureobasidi Pullulans IgE <0.10  Phoma Betae IgE <0.10  Stemphylium Herbarum IgE <0.10  Mouse Urine IgE <0.10  Resulting Agency [facility]       ABPA  Profile Class Description Allergens Comment  Comment:     Levels of Specific IgE       Class  Description of Class     ---------------------------  -----  --------------------                    < 0.10         0         Negative            0.10 -    0.31         0/I       Equivocal/Low            0.32 -    0.55         I          Low            0.56 -    1.40         II        Moderate            1.41 -    3.90         III       High            3.91 -   19.00         IV        Very High           19.01 -  100.00         V         Very High                   >100.00         VI        Very High  IgE (Immunoglobulin E), Serum 5 Low   Aspergillus Fumigatus IgE <0.10  Aspergillus fumigatus IgG 48.5    12/01/2023 HRCT Chest Innumerable tiny ground-glass nodules diffusely throughout the lungs although most concentrated in the lung bases. These are nonspecific and infectious or inflammatory. Although such nodules are most commonly seen in smoking-related respiratory bronchiolitis and other inhalational inflammatory lung disease such as hypersensitivity pneumonitis or occupational pneumoconiosis, bibasilar predominant distribution is not typical. 2. Mild, bland appearing bandlike scarring in the lung bases. No evidence of fibrotic interstitial lung disease. 3. Suspect minimal paraseptal emphysema. 4. Coronary artery disease.             Latest Ref Rng & Units 02/16/2024    4:15 PM  CBC  WBC 4.0 - 10.5 K/uL 5.3   Hemoglobin 12.0 - 15.0 g/dL 16.1   Hematocrit 09.6 - 46.0 % 42.2   Platelets 150.0 - 400.0 K/uL 257.0         No data to display          BNP No results found for: "BNP"  ProBNP No results found for: "PROBNP"  PFT No results found for: "FEV1PRE", "FEV1POST", "FVCPRE", "FVCPOST", "TLC", "DLCOUNC", "PREFEV1FVCRT", "PSTFEV1FVCRT"  No results found.   Past medical hx Past Medical History:  Diagnosis Date   Back pain    Hyperlipidemia    Varicose veins      Social History   Tobacco Use   Smoking status: Never    Passive exposure: Past  Substance Use Topics   Alcohol use: Yes    Comment: rare   Drug use: No    [name] reports that she has never smoked. She  has been exposed to tobacco smoke. She does not have any smokeless tobacco history on file. She reports current  alcohol use. She reports that she does not use drugs.  Tobacco Cessation: Counseling given: Not Answered Never smoker   Past surgical hx, Family hx, Social hx all reviewed.  Current Outpatient Medications on File Prior to Visit  Medication Sig   albuterol (VENTOLIN HFA) 108 (90 Base) MCG/ACT inhaler Inhale 1-2 puffs into the lungs every 6 (six) hours as needed for wheezing or shortness of breath.   Calcium Carb-Cholecalciferol (CALCIUM + D3) 600-200 MG-UNIT TABS Take by mouth daily.   fluticasone (FLONASE) 50 MCG/ACT nasal spray Place 1 spray into both nostrils daily.   levocetirizine (XYZAL) 5 MG tablet Take 5 mg by mouth every evening.   montelukast (SINGULAIR) 10 MG tablet Take 10 mg by mouth at bedtime.   Multiple Vitamin (MULTIVITAMIN) tablet Take 1 tablet by mouth daily.   naproxen sodium (ANAPROX) 220 MG tablet Take 220 mg by mouth daily as needed.   No current facility-administered medications on file prior to visit.     Allergies  Allergen Reactions   Codeine     Severe nausea    Review Of Systems:  Constitutional:   No  weight loss, night sweats,  Fevers, chills, fatigue, or  lassitude.  HEENT:   No headaches,  Difficulty swallowing,  Tooth/dental problems, or  Sore throat,                No sneezing, itching, ear ache, nasal congestion, post nasal drip,   CV:  No chest pain,  Orthopnea, PND, swelling in lower extremities, anasarca, dizziness, palpitations, syncope.   GI  No heartburn, indigestion, abdominal pain, nausea, vomiting, diarrhea, change in bowel habits, loss of appetite, bloody stools.   Resp: + shortness of breath with exertion none  at rest.  No excess mucus, no productive cough,  + non-productive cough,  No coughing up of blood.  No change in color of mucus.  No wheezing.  No chest wall deformity  Skin: no rash or lesions.  GU: no dysuria, change in color of urine, no urgency or frequency.  No flank pain, no hematuria   MS:  No joint pain or  swelling.  No decreased range of motion.  No back pain.  Psych:  No change in mood or affect. No depression or anxiety.  No memory loss.   Vital Signs BP 125/63 (BP Location: Left Arm, Patient Position: Sitting, Cuff Size: Normal)   Pulse (!) 59   Ht 5\' 6"  (1.676 m)   Wt 161 lb 12.8 oz (73.4 kg)   SpO2 96%   BMI 26.12 kg/m    Physical Exam:  General- No distress,  A&Ox3, pleasant  ENT: No sinus tenderness, TM clear, pale nasal mucosa, no oral exudate,no post nasal drip, no LAN Cardiac: S1, S2, regular rate and rhythm, no murmur Chest: No wheeze/ rales/ dullness; no accessory muscle use, no nasal flaring, no sternal retractions Abd.: Soft Non-tender, ND, BS +, Body mass index is 26.12 kg/m.  Ext: No clubbing cyanosis, edema, no obvious deformities Neuro:  normal strength. MAE x 4, A&O x 3, appropriate Skin: No rashes, warm and dry Psych: normal mood and behavior  5 Low    <0.10   48.5     Assessment/Plan Lung Nodules Innumerable tiny ground glass nodules diffusely scattered in the lungs, likely infectious or inflammatory.  Differential includes hypersensitivity pneumonitis, occupational pneumoconiosis, or other inhalational inflammatory diseases. No  evidence of interstitial lung disease. Plan We will do a CT Chest in June 2025 to re-evaluate your lung nodules.   Inflammatory Markers on lab assessment  Plan Your labs were + for some inflammatory markers.  We can consider referral to rheumatology if you continue to have issues with upper respiratory infections/ cough.  Emphysema on CT Chest Never smoker  Plan Please use the home Alpha 1 ID test to see if you have deficiency of Alpha 1.  This may explain the emphysema we see on your CXR.  We will consider PFT's l after we review the CT chest and Alpha 1 results  I spent 45 minutes dedicated to the care of this patient on the date of this encounter to include pre-visit review of records, face-to-face time with the  patient discussing conditions above, post visit ordering of testing, clinical documentation with the electronic health record, making appropriate referrals as documented, and communicating necessary information to the patient's healthcare team.    Raejean Bullock, NP 03/06/2024  1:58 PM

## 2024-03-12 ENCOUNTER — Encounter: Payer: Self-pay | Admitting: Acute Care

## 2024-05-07 ENCOUNTER — Ambulatory Visit (HOSPITAL_BASED_OUTPATIENT_CLINIC_OR_DEPARTMENT_OTHER)
Admission: RE | Admit: 2024-05-07 | Discharge: 2024-05-07 | Disposition: A | Discharge status: Home / Self Care | Source: Ambulatory Visit | Attending: Acute Care | Admitting: Acute Care

## 2024-05-07 DIAGNOSIS — I3139 Other pericardial effusion (noninflammatory): Secondary | ICD-10-CM | POA: Diagnosis not present

## 2024-05-07 DIAGNOSIS — R918 Other nonspecific abnormal finding of lung field: Secondary | ICD-10-CM | POA: Insufficient documentation

## 2024-05-07 DIAGNOSIS — J439 Emphysema, unspecified: Secondary | ICD-10-CM | POA: Diagnosis not present

## 2024-05-22 ENCOUNTER — Encounter: Payer: Self-pay | Admitting: Acute Care

## 2024-05-22 ENCOUNTER — Ambulatory Visit: Admitting: Acute Care

## 2024-05-22 VITALS — BP 134/72 | HR 56 | Ht 66.0 in | Wt 161.2 lb

## 2024-05-22 DIAGNOSIS — Z8701 Personal history of pneumonia (recurrent): Secondary | ICD-10-CM

## 2024-05-22 DIAGNOSIS — R918 Other nonspecific abnormal finding of lung field: Secondary | ICD-10-CM | POA: Diagnosis not present

## 2024-05-22 DIAGNOSIS — Z789 Other specified health status: Secondary | ICD-10-CM

## 2024-05-22 DIAGNOSIS — Z7722 Contact with and (suspected) exposure to environmental tobacco smoke (acute) (chronic): Secondary | ICD-10-CM

## 2024-05-22 DIAGNOSIS — J438 Other emphysema: Secondary | ICD-10-CM | POA: Diagnosis not present

## 2024-05-22 NOTE — Patient Instructions (Addendum)
 It is good to see you today. Your CT Chest is stable, but still shows the multiple micronodules. We will refer you to Immunology and rheumatology to be evaluated for immunodeficiency and inflammatory disease.  We will do a 6 month follow up Ct Chest to continue to monitor for any changes in these nodules. Follow up after to review the scan . Please do the Alpha 1 test when you can. If you have any questions about getting the Alpha 1 test done, call Karna Curly, RN at this office.  Call if you need us  sooner.   Please contact office for sooner follow up if symptoms do not improve or worsen or seek emergency care

## 2024-05-22 NOTE — Progress Notes (Signed)
 History of Present Illness Rebekah Schultz is a 68 y.o. female never smoker referred to Dr. Shelah for evaluation of multiple pulmonary lung nodules and recurrent pneumonia  noted on a Ct done to evaluate her kidneys 11/22/2023.   Synopsis 68 year old female with recurrent pneumonia and lung nodules who presents with a persistent cough.She was referred by her primary care physician for evaluation of lung nodules found incidentally during a kidney scan.   She has experienced a persistent cough for over two months, which began following a cold that progressed to pneumonia. The cough is productive, initially with green sputum, now clearer but still thick, and is most productive in the morning. Despite a course of prednisone, the cough persists.   She has a history of recurrent pneumonia, occurring four to five times over the past twenty years, with increased frequency recently. A recent CT scan revealed lung nodules, scarring in the lung bases, and low-level paraseptal emphysema, but no interstitial lung disease.   She has a history of allergies and sinus issues, previously managed with montelukast and levocetirizine. She discontinued montelukast due to concerns about memory loss, with no significant change in symptoms. A recent trip to [place]  exacerbated her sinus issues, leading to a course of prednisone.   Her past medical history includes exposure to secondhand smoke from her father during childhood and a history of sanding and painting furniture, which may have contributed to her respiratory issues. She denies any personal history of smoking.   Lab work revealed she has several inflammatory markers that were slightly positive. We discussed sending her for referral to rheumatology to have this further evaluated if she has continued issues with upper respiratory issues. She was unable to produce sputum for culture.   She also had notation of emphysema on her CT chest which is unusual in a never  smoker.  I have provided her with a home alpha-1 ID test to see if she is alpha-1 deficient.  She has not yet done the test.     05/22/2024 Patient presents for follow-up after repeat CT chest 05/07/2024.  Patient states she is currently totally asymptomatic.  She denies any cough, or upper respiratory issues.  She states these usually occur in the spring and the fall with allergy season.  What starts as rhinitis progresses to a pneumonia or more serious upper respiratory infection.  We have reviewed her most recent CT chest.  It continues to show innumerable 4 mm and smaller groundglass micronodules in both lungs predominantly but not excessively in the periphery of the lungs but most numerous in the bases.  There has not been a significant change in the size over the number or the distribution of the nodules.  Most likely diagnosis is a chronic inflammatory or infectious process such as respiratory bronchiolitis, occupational inhalational disease, or hypersensitivity pneumonitis.  Patient's previous lab work included hypersensitivity pneumonitis which was negative.  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 There are no interval enlarged lymph nodes, she continues to have notation of apical lung paraseptal emphysema.    We talked about next best steps.  I feel patient would benefit from both a rheumatologic and immunology consult.  She has had frequent pneumonia and has never had immunoglobulin workup.  Likewise she has never been to rheumatology to evaluate for chronic inflammatory disease.  Patient is in agreement with referral to both rheumatology and immunology.  I have asked  her to go ahead and send in the alpha-1 test so we can ensure that her alpha-1 level is adequate.  We did talk about possibly biopsying 1 of these nodules for definitive diagnosis.  As she is currently asymptomatic and very busy in her personal life she does not feel that this is  necessary at this point in time.  The cough which is what brought her to the practice is completely resolved at present time however she is unsure if it will re-present as we get closer to fall  Test Results: CT chest 05/07/2024 (read 05/16/2024)  Innumerable 4 mm and smaller ground-glass micronodules in both lungs, predominating but not exclusively seen in the periphery of the lungs and with the most numerous nodules symmetrically in the lung bases. No appreciable interval change in the number or distribution of the nodules. A chronic inflammatory or infectious process such as respiratory bronchiolitis, occupational inhalational disease or hypersensitivity pneumonitis is strongly favored although the scan is not diagnostic for any one disease process. 2. No interval enlarged nodules are seen. 3. Minimal apical lung paraseptal emphysema. 4. Aortic and coronary artery atherosclerosis. 5. Small chronic pericardial effusion. 6. Osteopenia and degenerative change.  12/01/2023 HRCT Chest Innumerable tiny ground-glass nodules diffusely throughout the lungs although most concentrated in the lung bases. These are nonspecific and infectious or inflammatory. Although such nodules are most commonly seen in smoking-related respiratory bronchiolitis and other inhalational inflammatory lung disease such as hypersensitivity pneumonitis or occupational pneumoconiosis, bibasilar predominant distribution is not typical. 2. Mild, bland appearing bandlike scarring in the lung bases. No evidence of fibrotic interstitial lung disease. 3. Suspect minimal paraseptal emphysema. 4. Coronary artery disease.           Hypersensitivity Pneumonitis >> Negative ANA 1:40 Sed Rate 46 Rheumatoid Factor 20 ANA Positive ACE 22 Perenial Allergen Profile>>  D Farinae IgE <0.10  Cat Dander IgE <0.10  Dog Dander IgE <0.10  Cow Dander IgE <0.10  Goose Feathers IgE <0.10  Chicken Feathers IgE <0.10  Duck Feathers  IgE <0.10  Penicillium Chrysogen IgE <0.10  Cladosporium Herbarum IgE <0.10  Mucor Racemosus IgE <0.10  Candida Albicans IgE <0.10  Alternaria Alternata IgE <0.10  Setomelanomma Rostrat <0.10  Aureobasidi Pullulans IgE <0.10  Phoma Betae IgE <0.10  Stemphylium Herbarum IgE <0.10  Mouse Urine IgE <0.10  ACE 22 IgE serum slightly low at 5 Aspergillus Fumigatus IgE <0.10  Aspergillus fumigatus IgG 48.5       Latest Ref Rng & Units 02/16/2024    4:15 PM  CBC  WBC 4.0 - 10.5 K/uL 5.3   Hemoglobin 12.0 - 15.0 g/dL 85.9   Hematocrit 63.9 - 46.0 % 42.2   Platelets 150.0 - 400.0 K/uL 257.0         No data to display          BNP No results found for: BNP  ProBNP No results found for: PROBNP  PFT No results found for: FEV1PRE, FEV1POST, FVCPRE, FVCPOST, TLC, DLCOUNC, PREFEV1FVCRT, PSTFEV1FVCRT  CT CHEST WO CONTRAST Result Date: 05/16/2024 CLINICAL DATA:  Multiple pulmonary nodules.  Follow-up. EXAM: CT CHEST WITHOUT CONTRAST TECHNIQUE: Multidetector CT imaging of the chest was performed following the standard protocol without IV contrast. RADIATION DOSE REDUCTION: This exam was performed according to the departmental dose-optimization program which includes automated exposure control, adjustment of the mA and/or kV according to patient size and/or use of iterative reconstruction technique. COMPARISON:  High-resolution chest CT 12/01/2023, CT abdomen and pelvis without contrast 11/22/2023.  FINDINGS: Cardiovascular: The cardiac size is upper limits normal, with small chronic pericardial effusion. There are mild scattered single-vessel calcific plaques in the LAD coronary artery. Normal caliber pulmonary arteries and veins. The aorta is normal in course and caliber with mild features of atherosclerosis in the isthmic segment. The unenhanced great vessels are unremarkable. Mediastinum/Nodes: No enlarged mediastinal or axillary lymph nodes. Thyroid  gland, trachea, and  esophagus demonstrate no significant findings. Lungs/Pleura: Again noted are innumerable 4 mm and smaller ground-glass micronodules in both lungs, involving all pulmonary segments, predominating but not exclusively seen in the periphery of the lungs and with the most numerous nodules symmetrically in the lung bases. There are occasional tiny calcified granulomas in the left lung but no calcified granulomas are noted on the right. There are scattered linear scar-like opacities again in both bases. Minimal paraseptal emphysema in the upper apices. There is no appreciable interval change in the numbers and distribution of the bilateral nodules. No interval enlarged nodules are seen. There is no consolidation, effusion or pneumothorax. Symmetric biapical pleuroparenchymal scarring is again shown. Central airways are clear. Upper Abdomen: No acute abnormality. Musculoskeletal: There is osteopenia, mild thoracic kyphosis and degenerative change. No acute or significant osseous findings. No mass in the visualized chest wall. IMPRESSION: 1. Innumerable 4 mm and smaller ground-glass micronodules in both lungs, predominating but not exclusively seen in the periphery of the lungs and with the most numerous nodules symmetrically in the lung bases. No appreciable interval change in the number or distribution of the nodules. A chronic inflammatory or infectious process such as respiratory bronchiolitis, occupational inhalational disease or hypersensitivity pneumonitis is strongly favored although the scan is not diagnostic for any one disease process. 2. No interval enlarged nodules are seen. 3. Minimal apical lung paraseptal emphysema. 4. Aortic and coronary artery atherosclerosis. 5. Small chronic pericardial effusion. 6. Osteopenia and degenerative change. Aortic Atherosclerosis (ICD10-I70.0). Electronically Signed   By: Francis Quam M.D.   On: 05/16/2024 00:08     Past medical hx Past Medical History:  Diagnosis Date    Back pain    Hyperlipidemia    Varicose veins      Social History   Tobacco Use   Smoking status: Never    Passive exposure: Past  Substance Use Topics   Alcohol use: Yes    Comment: rare   Drug use: No    Ms.Wahab reports that she has never smoked. She has been exposed to tobacco smoke. She does not have any smokeless tobacco history on file. She reports current alcohol use. She reports that she does not use drugs.  Tobacco Cessation: Counseling given: Not Answered Patient is a never smoker  Past surgical hx, Family hx, Social hx all reviewed.  Current Outpatient Medications on File Prior to Visit  Medication Sig   albuterol (VENTOLIN HFA) 108 (90 Base) MCG/ACT inhaler Inhale 1-2 puffs into the lungs every 6 (six) hours as needed for wheezing or shortness of breath.   Calcium Carb-Cholecalciferol (CALCIUM + D3) 600-200 MG-UNIT TABS Take by mouth daily.   fluticasone (FLONASE) 50 MCG/ACT nasal spray Place 1 spray into both nostrils daily.   levocetirizine (XYZAL) 5 MG tablet Take 5 mg by mouth every evening.   Multiple Vitamin (MULTIVITAMIN) tablet Take 1 tablet by mouth daily.   naproxen sodium (ANAPROX) 220 MG tablet Take 220 mg by mouth daily as needed.   montelukast (SINGULAIR) 10 MG tablet Take 10 mg by mouth at bedtime. (Patient not taking: Reported on 05/22/2024)  No current facility-administered medications on file prior to visit.     Allergies  Allergen Reactions   Codeine     Severe nausea    Review Of Systems:  Constitutional:   No  weight loss, night sweats,  Fevers, chills, fatigue, or  lassitude.  HEENT:   No headaches,  Difficulty swallowing,  Tooth/dental problems, or  Sore throat,                No sneezing, itching, ear ache, nasal congestion, post nasal drip,   CV:  No chest pain,  Orthopnea, PND, swelling in lower extremities, anasarca, dizziness, palpitations, syncope.   GI  No heartburn, indigestion, abdominal pain, nausea, vomiting,  diarrhea, change in bowel habits, loss of appetite, bloody stools.   Resp: No shortness of breath with exertion or at rest.  No excess mucus, no productive cough,  No non-productive cough,  No coughing up of blood.  No change in color of mucus.  No wheezing.  No chest wall deformity  Skin: no rash or lesions.  GU: no dysuria, change in color of urine, no urgency or frequency.  No flank pain, no hematuria   MS:  No joint pain or swelling.  No decreased range of motion.  No back pain.  Psych:  No change in mood or affect. No depression or anxiety.  No memory loss.   Vital Signs BP 134/72 (BP Location: Left Arm, Patient Position: Sitting, Cuff Size: Normal)   Pulse (!) 56   Ht 5' 6 (1.676 m)   Wt 161 lb 3.2 oz (73.1 kg)   SpO2 99%   BMI 26.02 kg/m    Physical Exam:  General- No distress,  A&Ox3, pleasant and appropriate ENT: No sinus tenderness, TM clear, pale nasal mucosa, no oral exudate,no post nasal drip, no LAN Cardiac: S1, S2, regular rate and rhythm, no murmur Chest: No wheeze/ rales/ dullness; no accessory muscle use, no nasal flaring, no sternal retractions Abd.: Soft Non-tender, nondistended, bowel sounds positive,Body mass index is 26.02 kg/m.  Ext: No clubbing cyanosis, edema, no obvious deformities Neuro:  normal strength, moving all extremities x 4, alert and oriented x 3, Skin: No rashes, warm and dry, no obvious skin lesions Psych: normal mood and behavior   Assessment/Plan Innumerable tiny groundglass lung nodules likely infectious or inflammatory Differential diagnosis includes hypersensitivity pneumonitis, occupational pneumoconiosis, or other inhalational inflammatory diseases. CT chest is stable when compared to previous imaging done in January 2025 Plan Your CT Chest is stable, but still shows the multiple micronodules. We will refer you to Immunology and rheumatology to be evaluated for immunodeficiency and inflammatory disease.  We will do a 6 month  follow up Ct Chest to continue to monitor for any changes in these nodules. Follow up after to review the scan . Please do the Alpha 1 test when you can. If you have any questions about getting the Alpha 1 test done, call Karna Curly, RN at this office.  Call if you need us  sooner.   Call for unexplained weight loss or hemoptysis, blood in sputum Please contact office for sooner follow up if symptoms do not improve or worsen or seek emergency care    I spent 35 minutes dedicated to the care of this patient on the date of this encounter to include pre-visit review of records, face-to-face time with the patient discussing conditions above, post visit ordering of testing, clinical documentation with the electronic health record, making appropriate referrals as documented, and communicating necessary  information to the patient's healthcare team.    Lauraine JULIANNA Lites, NP 05/22/2024  2:17 PM

## 2024-05-23 NOTE — Addendum Note (Signed)
 Addended by: Alyissa Whidbee F on: 05/23/2024 12:13 PM   Modules accepted: Orders

## 2024-06-28 DIAGNOSIS — E785 Hyperlipidemia, unspecified: Secondary | ICD-10-CM | POA: Diagnosis not present

## 2024-06-28 DIAGNOSIS — Z0189 Encounter for other specified special examinations: Secondary | ICD-10-CM | POA: Diagnosis not present

## 2024-06-28 DIAGNOSIS — D72819 Decreased white blood cell count, unspecified: Secondary | ICD-10-CM | POA: Diagnosis not present

## 2024-07-05 DIAGNOSIS — J4489 Other specified chronic obstructive pulmonary disease: Secondary | ICD-10-CM | POA: Diagnosis not present

## 2024-07-05 DIAGNOSIS — Z0000 Encounter for general adult medical examination without abnormal findings: Secondary | ICD-10-CM | POA: Diagnosis not present

## 2024-07-05 DIAGNOSIS — E785 Hyperlipidemia, unspecified: Secondary | ICD-10-CM | POA: Diagnosis not present

## 2024-07-05 DIAGNOSIS — Z1331 Encounter for screening for depression: Secondary | ICD-10-CM | POA: Diagnosis not present

## 2024-07-05 DIAGNOSIS — Z1159 Encounter for screening for other viral diseases: Secondary | ICD-10-CM | POA: Diagnosis not present

## 2024-07-05 DIAGNOSIS — Z1339 Encounter for screening examination for other mental health and behavioral disorders: Secondary | ICD-10-CM | POA: Diagnosis not present

## 2024-07-25 DIAGNOSIS — I700 Atherosclerosis of aorta: Secondary | ICD-10-CM | POA: Insufficient documentation

## 2024-07-25 DIAGNOSIS — R931 Abnormal findings on diagnostic imaging of heart and coronary circulation: Secondary | ICD-10-CM | POA: Insufficient documentation

## 2024-07-25 NOTE — Progress Notes (Unsigned)
 Cardiology Office Note   Date:  07/26/2024   ID:  Rebekah Schultz, DOB 04/23/1956, MRN [id number]  PCP:  Stephane Leita DEL, MD  Cardiologist:   None Referring:  Ruthell Lauraine FALCON, NP  No chief complaint on file.     History of Present Illness: Rebekah Schultz is a 68 y.o. female who presents for evaluation of elevated coronary calcium and aortic atherosclerosis on CT.   The patient has no past cardiac history.  However, she does have a significant family history of early onset coronary disease.  She does not exercise routinely but she does a lot of yard work.  The patient denies any new symptoms such as chest discomfort, neck or arm discomfort. There has been no new shortness of breath, PND or orthopnea. There have been no reported palpitations, presyncope or syncope.     Past Medical History:  Diagnosis Date   Back pain    Hyperlipidemia    Lung nodules    Varicose veins     Past Surgical History:  Procedure Laterality Date   EYE SURGERY     age 71   scar tissue removal from eye     age 58     Current Outpatient Medications  Medication Sig Dispense Refill   albuterol (VENTOLIN HFA) 108 (90 Base) MCG/ACT inhaler Inhale 1-2 puffs into the lungs every 6 (six) hours as needed for wheezing or shortness of breath.     Calcium Carb-Cholecalciferol (CALCIUM + D3) 600-200 MG-UNIT TABS Take by mouth daily.     fluticasone (FLONASE) 50 MCG/ACT nasal spray Place 1 spray into both nostrils daily.     levocetirizine (XYZAL) 5 MG tablet Take 5 mg by mouth every evening.     Multiple Vitamin (MULTIVITAMIN) tablet Take 1 tablet by mouth daily.     naproxen sodium (ANAPROX) 220 MG tablet Take 220 mg by mouth daily as needed.     pravastatin  (PRAVACHOL ) 40 MG tablet Take 1 tablet (40 mg total) by mouth daily. 90 tablet 3   montelukast (SINGULAIR) 10 MG tablet Take 10 mg by mouth at bedtime. (Patient not taking: Reported on 07/26/2024)     No current facility-administered medications for this  visit.    Allergies:   Codeine    Social History:  The patient  reports that she has never smoked. She has been exposed to tobacco smoke. She does not have any smokeless tobacco history on file. She reports current alcohol use. She reports that she does not use drugs.   Family History:  The patient's family history includes Cancer in her brother, father, maternal grandmother, maternal uncle, and mother; Diabetes in her brother, father, paternal grandfather, paternal grandmother, and sister; Heart disease in her paternal grandmother; Hyperlipidemia in her sister; Hypertension in her brother.   Her brother did have bypass in his 30s.   ROS:  Please see the history of present illness.   Otherwise, review of systems are positive for none.   All other systems are reviewed and negative.    PHYSICAL EXAM: VS:  BP 120/66   Pulse (!) 52   Ht 5' 7 (1.702 m)   Wt 157 lb (71.2 kg)   SpO2 94%   BMI 24.59 kg/m  , BMI Body mass index is 24.59 kg/m. GENERAL:  Well appearing HEENT:  Pupils equal round and reactive, fundi not visualized, oral mucosa unremarkable NECK:  No jugular venous distention, waveform within normal limits, carotid upstroke brisk and symmetric, no bruits,  no thyromegaly LYMPHATICS:  No cervical, inguinal adenopathy LUNGS:  Clear to auscultation bilaterally BACK:  No CVA tenderness CHEST:  Unremarkable HEART:  PMI not displaced or sustained,S1 and S2 within normal limits, no S3, no S4, no clicks, no rubs, no murmurs ABD:  Flat, positive bowel sounds normal in frequency in pitch, no bruits, no rebound, no guarding, no midline pulsatile mass, no hepatomegaly, no splenomegaly EXT:  2 plus pulses throughout, no edema, no cyanosis no clubbing SKIN:  No rashes no nodules NEURO:  Cranial nerves II through XII grossly intact, motor grossly intact throughout [facility]:  Cognitively intact, oriented to person place and time    EKG:  EKG Interpretation Date/Time:  Thursday July 26 2024 11:47:53 EDT Ventricular Rate:  52 PR Interval:  182 QRS Duration:  112 QT Interval:  446 QTC Calculation: 414 R Axis:   25  Text Interpretation: Sinus bradycardia with sinus arrhythmia Right bundle branch block Confirmed by Lavona Agent ([id number]) on 07/26/2024 12:44:47 PM     Recent Labs: 02/16/2024: Hemoglobin 14.0; Platelets 257.0    Lipid Panel No results found for: CHOL, TRIG, HDL, CHOLHDL, VLDL, LDLCALC, LDLDIRECT    Wt Readings from Last 3 Encounters:  07/26/24 157 lb (71.2 kg)  05/22/24 161 lb 3.2 oz (73.1 kg)  03/06/24 161 lb 12.8 oz (73.4 kg)      Other studies Reviewed: Additional studies/ records that were reviewed today include: Labs. Review of the above records demonstrates:  Please see elsewhere in the note.     ASSESSMENT AND PLAN:   Aortic atherosclerosis: This will be addressed with primary risk reduction as below.  Coronary calcium: She does have elevated coronary calcium as described. I will bring the patient back for a POET (Plain Old Exercise Test). This will allow me to screen for obstructive coronary disease, risk stratify and very importantly provide a prescription for exercise.  Dyslipidemia: LDL had been 148 in 2024.  With some diet changes it went down to 108.  We had a long conversation about risk benefits of a statin and I have suggested pravastatin  40 mg daily with a repeat lipid profile and LP(a) in 3 months.  Right bundle branch block: This is an incidental finding.  I do not have an old EKG for comparison.  She has no symptoms related to this.  No change in therapy.  Current medicines are reviewed at length with the patient today.  The patient  concerns regarding medicines.  The following changes have been made:  no change  Labs/ tests ordered today include:   Orders Placed This Encounter  Procedures   Lipoprotein A (LPA)   Lipid panel   EXERCISE TOLERANCE TEST (ETT)   EKG 12-Lead     Disposition:   FU with  with me as needed based on the results of the above.   Signed, Agent Lavona, MD  07/26/2024 12:47 PM    [facility]

## 2024-07-26 ENCOUNTER — Encounter: Payer: Self-pay | Admitting: Cardiology

## 2024-07-26 ENCOUNTER — Ambulatory Visit: Payer: Self-pay | Attending: Cardiology | Admitting: Cardiology

## 2024-07-26 VITALS — BP 120/66 | HR 52 | Ht 67.0 in | Wt 157.0 lb

## 2024-07-26 DIAGNOSIS — I700 Atherosclerosis of aorta: Secondary | ICD-10-CM

## 2024-07-26 DIAGNOSIS — E785 Hyperlipidemia, unspecified: Secondary | ICD-10-CM

## 2024-07-26 DIAGNOSIS — R931 Abnormal findings on diagnostic imaging of heart and coronary circulation: Secondary | ICD-10-CM | POA: Diagnosis not present

## 2024-07-26 MED ORDER — PRAVASTATIN SODIUM 40 MG PO TABS
40.0000 mg | ORAL_TABLET | Freq: Every day | ORAL | 3 refills | Status: AC
Start: 2024-07-26 — End: ?

## 2024-07-26 NOTE — Patient Instructions (Signed)
 Medication Instructions:  Pravastatin  40 mg once daily *If you need a refill on your cardiac medications before your next appointment, please call your pharmacy*  Lab Work: Fasting lipid panel, Lp(a) in 3 months at [facility] If you have labs (blood work) drawn today and your tests are completely normal, you will receive your results only by: MyChart Message (if you have MyChart) OR A paper copy in the mail If you have any lab test that is abnormal or we need to change your treatment, we will call you to review the results.  Testing/Procedures: Exercise Stress Test **No food or drink for 4 hours prior to test **No caffeine, decaf or chocolate 12 hours prior to test **Wear comfortable clothing and close toed shoes  Follow-Up: At [facility], you and your health needs are our priority.  As part of our continuing mission to provide you with exceptional heart care, our providers are all part of one team.  This team includes your primary Cardiologist (physician) and Advanced Practice Providers or APPs (Physician Assistants and Nurse Practitioners) who all work together to provide you with the care you need, when you need it.  Your next appointment:   As needed  Provider:   Lavona, MD  We recommend signing up for the patient portal called MyChart.  Sign up information is provided on this After Visit Summary.  MyChart is used to connect with patients for Virtual Visits (Telemedicine).  Patients are able to view lab/test results, encounter notes, upcoming appointments, etc.  Non-urgent messages can be sent to your provider as well.   To learn more about what you can do with MyChart, go to [email or web address].

## 2024-07-31 DIAGNOSIS — J300 Vasomotor rhinitis: Secondary | ICD-10-CM | POA: Diagnosis not present

## 2024-07-31 DIAGNOSIS — H1045 Other chronic allergic conjunctivitis: Secondary | ICD-10-CM | POA: Diagnosis not present

## 2024-07-31 DIAGNOSIS — R058 Other specified cough: Secondary | ICD-10-CM | POA: Diagnosis not present

## 2024-08-20 DIAGNOSIS — M810 Age-related osteoporosis without current pathological fracture: Secondary | ICD-10-CM | POA: Diagnosis not present

## 2024-08-24 DIAGNOSIS — M810 Age-related osteoporosis without current pathological fracture: Secondary | ICD-10-CM | POA: Diagnosis not present

## 2024-08-24 DIAGNOSIS — M19041 Primary osteoarthritis, right hand: Secondary | ICD-10-CM | POA: Diagnosis not present

## 2024-08-31 ENCOUNTER — Encounter (HOSPITAL_COMMUNITY): Payer: Self-pay | Admitting: *Deleted

## 2024-09-07 DIAGNOSIS — M810 Age-related osteoporosis without current pathological fracture: Secondary | ICD-10-CM | POA: Diagnosis not present

## 2024-09-10 ENCOUNTER — Ambulatory Visit (HOSPITAL_COMMUNITY)
Admission: RE | Admit: 2024-09-10 | Discharge: 2024-09-10 | Disposition: A | Discharge status: Home / Self Care | Source: Ambulatory Visit | Attending: Cardiology | Admitting: Cardiology

## 2024-09-10 ENCOUNTER — Ambulatory Visit: Payer: Self-pay | Admitting: Cardiology

## 2024-09-10 DIAGNOSIS — R931 Abnormal findings on diagnostic imaging of heart and coronary circulation: Secondary | ICD-10-CM | POA: Diagnosis not present

## 2024-09-10 LAB — EXERCISE TOLERANCE TEST
Angina Index: 0
Duke Treadmill Score: 7
Estimated workload: 8.5
Exercise duration (min): 7 min
MPHR: 152 {beats}/min
Peak HR: 150 {beats}/min
Percent HR: 96 %
Rest HR: 62 {beats}/min
ST Depression (mm): 0 mm

## 2024-09-12 ENCOUNTER — Other Ambulatory Visit (HOSPITAL_COMMUNITY): Payer: Self-pay | Admitting: Internal Medicine

## 2024-09-12 ENCOUNTER — Telehealth (HOSPITAL_COMMUNITY): Payer: Self-pay | Admitting: Pharmacy Technician

## 2024-09-12 DIAGNOSIS — M810 Age-related osteoporosis without current pathological fracture: Secondary | ICD-10-CM | POA: Insufficient documentation

## 2024-09-12 NOTE — Telephone Encounter (Signed)
 Auth Submission: NO AUTH NEEDED Site of care: [facility] Payer: [employer] MEDICARE Medication & CPT/J Code(s) submitted: Reclast (Zolendronic acid) [id number] Diagnosis Code: M81.0 Route of submission (phone, fax, portal):  Phone # Fax # Auth type: Buy/Bill HB Units/visits requested: 5mg  x 1 dose, q 12 months Reference number:  Approval from: 09/12/2024 to 11/21/24     Dagoberto Armour, CPhT [facility] Phone: [phone] 09/12/2024

## 2024-10-09 DIAGNOSIS — H524 Presbyopia: Secondary | ICD-10-CM | POA: Diagnosis not present

## 2024-10-10 DIAGNOSIS — M25562 Pain in left knee: Secondary | ICD-10-CM | POA: Diagnosis not present

## 2024-10-11 ENCOUNTER — Ambulatory Visit (HOSPITAL_COMMUNITY)
Admission: RE | Admit: 2024-10-11 | Discharge: 2024-10-11 | Disposition: A | Discharge status: Home / Self Care | Source: Ambulatory Visit | Attending: Internal Medicine | Admitting: Internal Medicine

## 2024-10-11 VITALS — BP 121/69 | HR 53 | Temp 97.6°F | Resp 16

## 2024-10-11 DIAGNOSIS — M810 Age-related osteoporosis without current pathological fracture: Secondary | ICD-10-CM | POA: Diagnosis not present

## 2024-10-11 MED ORDER — ACETAMINOPHEN 325 MG PO TABS
650.0000 mg | ORAL_TABLET | Freq: Once | ORAL | Status: AC
  Administered 2024-10-11: 650 mg via ORAL

## 2024-10-11 MED ORDER — ZOLEDRONIC ACID 5 MG/100ML IV SOLN
5.0000 mg | Freq: Once | INTRAVENOUS | Status: AC
  Administered 2024-10-11: 5 mg via INTRAVENOUS

## 2024-10-11 MED ORDER — ZOLEDRONIC ACID 5 MG/100ML IV SOLN
INTRAVENOUS | Status: AC
  Filled 2024-10-11: qty 100

## 2024-10-11 MED ORDER — DIPHENHYDRAMINE HCL 25 MG PO CAPS: 25.0000 mg | ORAL_CAPSULE | Freq: Once | ORAL | Status: DC

## 2024-10-11 MED ORDER — SODIUM CHLORIDE 0.9 % IV SOLN: INTRAVENOUS | Status: DC

## 2024-10-11 MED ORDER — ACETAMINOPHEN 325 MG PO TABS
ORAL_TABLET | ORAL | Status: AC
  Filled 2024-10-11: qty 2

## 2024-11-08 ENCOUNTER — Other Ambulatory Visit: Payer: Self-pay

## 2024-11-08 ENCOUNTER — Ambulatory Visit: Admitting: Surgical

## 2024-11-08 DIAGNOSIS — M25562 Pain in left knee: Secondary | ICD-10-CM | POA: Diagnosis not present

## 2024-11-08 DIAGNOSIS — M25462 Effusion, left knee: Secondary | ICD-10-CM | POA: Diagnosis not present

## 2024-11-08 NOTE — Progress Notes (Unsigned)
 ultr

## 2024-11-11 ENCOUNTER — Encounter: Payer: Self-pay | Admitting: Surgical

## 2024-11-11 MED ORDER — BUPIVACAINE HCL 0.25 % IJ SOLN
4.0000 mL | INTRAMUSCULAR | Status: AC | PRN
  Administered 2024-11-08: 4 mL via INTRA_ARTICULAR

## 2024-11-11 MED ORDER — LIDOCAINE HCL 1 % IJ SOLN
10.0000 mL | INTRAMUSCULAR | Status: AC | PRN
  Administered 2024-11-08: 10 mL

## 2024-11-11 MED ORDER — METHYLPREDNISOLONE ACETATE 40 MG/ML IJ SUSP
40.0000 mg | INTRAMUSCULAR | Status: AC | PRN
  Administered 2024-11-08: 40 mg via INTRA_ARTICULAR

## 2024-11-11 NOTE — Progress Notes (Signed)
 "  Office Visit Note   Patient: Rebekah Schultz           Date of Birth: [birth year]           MRN: [id number] Visit Date: 11/08/2024 Requested by: Stephane Leita DEL, MD [address] [place],  [place] [address] PCP: Stephane Leita DEL, MD  Subjective: Chief Complaint  Patient presents with   Left Knee - Pain    HPI: Rebekah Schultz is a 68 y.o. female who presents to the office reporting left knee pain.  Patient states that about 1 month ago she was put on an oral medication for osteoporosis.  She accidentally took a once weekly dose of Fosamax once a day for about 5 days and as a result she began experiencing a lot of severe pain and swelling in the majority of her joints.  She discontinued the medication and all of her joint pain went away aside from her left knee.  This knee has continued to stay painful and swollen.  She has difficulty with full extension or fully flexing the knee.  She has stiffness with immobility.  Describes diffuse pain throughout the knee with posterior pain in particular.  She denies any history of prior injury or surgery or any issue with this knee before.  She tries to keep active but this knee is worse when she exercises.  Aleve will help but Tylenol  does not do anything for her.  She describes a decrease in her range of motion.  No new clicking or popping in the knee.  This has been ongoing for about a month now..                ROS: All systems reviewed are negative as they relate to the chief complaint within the history of present illness.  Patient denies fevers or chills.  Assessment & Plan: Visit Diagnoses:  1. Acute pain of left knee     Plan: Impression is 68 year old female with left knee pain that has been ongoing since an abnormally high dosage of Fosamax.  Most of her joint pains have subsided but she has had knee pain that has not abated.  Radiographs are negative for any significant degenerative changes or any acute osseous abnormality.  We discussed options  available patient.  She would like to try aspiration and injection.  She is concerned about Baker's cyst.  While in prone position, ultrasound exam demonstrates very small Baker's cyst but there is larger hypoechoic signal within the muscular architecture of the medial head of the gastroc.  This may represent tennis leg as a potential source of her symptoms; muscle/tendon rupture is not really a recognized complication of Fosamax but this is less likely but may be worth evaluation with MRI scan if symptoms do not improve with injection.  Baker's cyst was aspirated of 4 cc and the left knee was aspirated of 11 cc and following aspiration a cortisone injection was administered without complication.  Patient tolerated procedure well.  She will follow-up with the office as needed if symptoms do not improve and she is welcome to reach out in 1 to 2 weeks to let us  know how she is doing.  She is having no symptoms localizing to the groin or thigh region.  Follow-Up Instructions: No follow-ups on file.   Orders:  Orders Placed This Encounter  Procedures   XR KNEE 3 VIEW LEFT   US  Guided Needle Placement - No Linked Charges   No orders of the  defined types were placed in this encounter.     Procedures: Large Joint Inj: L knee aspiration/inj with bakers cyst aspiration on 11/08/2024 12:04 PM Indications: diagnostic evaluation, joint swelling and pain Details: 18 G 1.5 in needle, superolateral approach  Arthrogram: No  Medications: 10 mL lidocaine  1 %; 4 mL bupivacaine  0.25 %; 40 mg methylPREDNISolone  acetate 40 MG/ML Aspirate: 15 mL Outcome: tolerated well, no immediate complications Procedure, treatment alternatives, risks and benefits explained, specific risks discussed. Consent was given by the patient. Immediately prior to procedure a time out was called to verify the correct patient, procedure, equipment, support staff and site/side marked as required. Patient was prepped and draped in the  usual sterile fashion.       Clinical Data: No additional findings.  Objective: Vital Signs: There were no vitals taken for this visit.  Physical Exam:  Constitutional: Patient appears well-developed HEENT:  Head: Normocephalic Eyes:EOM are normal Neck: Normal range of motion Cardiovascular: Normal rate Pulmonary/chest: Effort normal Neurologic: Patient is alert Skin: Skin is warm Psychiatric: Patient has normal mood and affect  Ortho Exam: Ortho exam demonstrates positive effusion with tenderness over the medial joint line moderately and lateral joint line mildly.  She has 0 degrees extension and 120 degrees of knee flexion.  From 0 to 5 degrees of knee flexion and from 105 to 120 degrees of knee flexion, she does have increased pain.  No pain with hip range of motion.  Stable to anterior and posterior drawer sign.  Stable to varus and valgus stress at 0 and 30 degrees.  She is able to perform straight leg raise without extensor lag.  Excellent quad and hamstring strength.  She has excellent plantarflexion strength as well without much reproduction of pain though she has a little bit of posterior medial knee pain with this.  There is no discernible plantarflexion weakness compared with contralateral side.  No dorsiflexion weakness compared to contralateral side with both sides having DF/PF strength rated 5/5.  Palp DP pulse of the left lower extremity.  Specialty Comments:  No specialty comments available.  Imaging: No results found.   PMFS History: Patient Active Problem List   Diagnosis Date Noted   Senile osteoporosis 09/12/2024   Elevated coronary artery calcium score 07/25/2024   Aortic atherosclerosis 07/25/2024   Past Medical History:  Diagnosis Date   Back pain    Hyperlipidemia    Lung nodules    Varicose veins     Family History  Problem Relation Age of Onset   Cancer Mother    Diabetes Father    Cancer Father    Diabetes Sister    Hyperlipidemia Sister     Cancer Brother    Diabetes Brother    Hypertension Brother    Cancer Maternal Grandmother    Diabetes Paternal Grandmother    Heart disease Paternal Grandmother    Diabetes Paternal Grandfather    Cancer Maternal Uncle     Past Surgical History:  Procedure Laterality Date   EYE SURGERY     age 28   scar tissue removal from eye     age 17   Social History   Occupational History   Not on file  Tobacco Use   Smoking status: Never    Passive exposure: Past   Smokeless tobacco: Not on file  Substance and Sexual Activity   Alcohol  use: Yes    Comment: rare   Drug use: No   Sexual activity: Not on file        "

## 2024-11-20 ENCOUNTER — Telehealth: Payer: Self-pay | Admitting: Surgical

## 2024-11-20 NOTE — Telephone Encounter (Signed)
 Pt states she's struggling with her knee, to get in and out the car. Pt states the pain start about 4/5 days after the aspiration on 11/08/24. Pt wants to be seen ASAP. Please advise

## 2024-11-20 NOTE — Telephone Encounter (Signed)
 She needs mri ok to come in for me to eval - probably needs hip xrays too - ok to add to the fun in am

## 2024-11-20 NOTE — Telephone Encounter (Signed)
 I left voicemail for patient. Worked in at 1015 in the morning. Asked for return call if she cannot make it.

## 2024-11-21 ENCOUNTER — Ambulatory Visit: Admitting: Orthopedic Surgery

## 2024-11-21 ENCOUNTER — Telehealth: Payer: Self-pay | Admitting: Orthopedic Surgery

## 2024-11-21 ENCOUNTER — Encounter: Payer: Self-pay | Admitting: Radiology

## 2024-11-21 ENCOUNTER — Encounter: Payer: Self-pay | Admitting: Orthopedic Surgery

## 2024-11-21 DIAGNOSIS — M25562 Pain in left knee: Secondary | ICD-10-CM | POA: Diagnosis not present

## 2024-11-21 DIAGNOSIS — M25462 Effusion, left knee: Secondary | ICD-10-CM | POA: Diagnosis not present

## 2024-11-21 NOTE — Telephone Encounter (Signed)
 I left voicemail for patient.  Worked in at 2:15p this afternoon with Dr. Addie.

## 2024-11-21 NOTE — Progress Notes (Unsigned)
 "  Office Visit Note   Patient: Rebekah Schultz           Date of Birth: 1956/07/17           MRN: [id number] Visit Date: 11/21/2024 Requested by: Stephane Leita DEL, MD [address] [place],  [place] [address] PCP: Stephane Leita DEL, MD  Subjective: Chief Complaint  Patient presents with   Left Leg - Pain   Left Knee - Pain    HPI: Rebekah Schultz is a 68 y.o. female who presents to the office reporting continued left knee pain.  Denies any groin pain.  Has an occasional pain in the buttock and back.  Does radiate down but there is no numbness and tingling.  States that she could not really walk yesterday.  Was seen 11/08/2025 with aspiration of the Baker's cyst and injection of the knee from which she did well for about 4 to 5 days.  She has started on prednisone and doxycycline for a respiratory virus.  The prednisone has helped her knee situation.  Nonetheless she is having continued symptoms and swelling in that left knee..                ROS: All systems reviewed are negative as they relate to the chief complaint within the history of present illness.  Patient denies fevers or chills.  Assessment & Plan: Visit Diagnoses:  1. Acute pain of left knee   2. Effusion, left knee     Plan: Impression is recurrent left knee effusion with possible stress reaction versus meniscal root tear.  Plan MRI urgent of that left knee to evaluate the pathology that is really preventing her from weightbearing.  Follow-up after that study.  No further injections indicated at this time.  Follow-Up Instructions: No follow-ups on file.   Orders:  Orders Placed This Encounter  Procedures   MR Knee Left w/o contrast   No orders of the defined types were placed in this encounter.     Procedures: No procedures performed   Clinical Data: No additional findings.  Objective: Vital Signs: There were no vitals taken for this visit.  Physical Exam:  Constitutional: Patient appears well-developed HEENT:   Head: Normocephalic Eyes:EOM are normal Neck: Normal range of motion Cardiovascular: Normal rate Pulmonary/chest: Effort normal Neurologic: Patient is alert Skin: Skin is warm Psychiatric: Patient has normal mood and affect  Ortho Exam: Ortho exam demonstrates about 10 degrees less flexion in the left knee compared to the right because of the mild to moderate effusion.  No proximal lymphadenopathy.  Collateral cruciate ligaments are stable.  Does have primarily medial and posterior pain which is worse with flexion.  Extensor mechanism intact and nontender.  No other masses lymphadenopathy or skin changes noted in that left knee region.  Specialty Comments:  No specialty comments available.  Imaging: No results found.   PMFS History: Patient Active Problem List   Diagnosis Date Noted   Senile osteoporosis 09/12/2024   Elevated coronary artery calcium score 07/25/2024   Aortic atherosclerosis 07/25/2024   Past Medical History:  Diagnosis Date   Back pain    Hyperlipidemia    Lung nodules    Varicose veins     Family History  Problem Relation Age of Onset   Cancer Mother    Diabetes Father    Cancer Father    Diabetes Sister    Hyperlipidemia Sister    Cancer Brother    Diabetes Brother    Hypertension Brother  Cancer Maternal Grandmother    Diabetes Paternal Grandmother    Heart disease Paternal Grandmother    Diabetes Paternal Grandfather    Cancer Maternal Uncle     Past Surgical History:  Procedure Laterality Date   EYE SURGERY     age 36   scar tissue removal from eye     age 81   Social History   Occupational History   Not on file  Tobacco Use   Smoking status: Never    Passive exposure: Past   Smokeless tobacco: Not on file  Substance and Sexual Activity   Alcohol  use: Yes    Comment: rare   Drug use: No   Sexual activity: Not on file        "

## 2024-11-21 NOTE — Telephone Encounter (Signed)
 Pt called stating she missed her appt and Deane called and she is returning [place] call. Please call pt at [phone].

## 2024-11-23 ENCOUNTER — Ambulatory Visit
Admission: RE | Admit: 2024-11-23 | Discharge: 2024-11-23 | Disposition: A | Discharge status: Home / Self Care | Source: Ambulatory Visit | Attending: Orthopedic Surgery | Admitting: Orthopedic Surgery

## 2024-11-23 ENCOUNTER — Ambulatory Visit: Admitting: Nurse Practitioner

## 2024-11-23 ENCOUNTER — Other Ambulatory Visit (HOSPITAL_BASED_OUTPATIENT_CLINIC_OR_DEPARTMENT_OTHER)

## 2024-11-23 DIAGNOSIS — M25562 Pain in left knee: Secondary | ICD-10-CM

## 2024-11-23 DIAGNOSIS — M25462 Effusion, left knee: Secondary | ICD-10-CM

## 2024-11-27 ENCOUNTER — Ambulatory Visit: Payer: Self-pay | Admitting: Orthopedic Surgery

## 2024-11-27 NOTE — Progress Notes (Signed)
 Scan ok no nwb reason identified ok to f/u with luke or me thx

## 2024-11-27 NOTE — Progress Notes (Signed)
I called lmom

## 2024-11-28 ENCOUNTER — Ambulatory Visit (HOSPITAL_BASED_OUTPATIENT_CLINIC_OR_DEPARTMENT_OTHER)
Admission: RE | Admit: 2024-11-28 | Discharge: 2024-11-28 | Disposition: A | Discharge status: Home / Self Care | Source: Ambulatory Visit | Attending: Acute Care | Admitting: Acute Care

## 2024-11-28 DIAGNOSIS — R918 Other nonspecific abnormal finding of lung field: Secondary | ICD-10-CM | POA: Diagnosis present

## 2024-12-12 ENCOUNTER — Encounter: Payer: Self-pay | Admitting: Acute Care

## 2024-12-12 ENCOUNTER — Ambulatory Visit: Admitting: Acute Care

## 2024-12-12 VITALS — BP 105/72 | HR 61 | Temp 97.7°F | Ht 66.5 in | Wt 142.0 lb

## 2024-12-12 DIAGNOSIS — J432 Centrilobular emphysema: Secondary | ICD-10-CM | POA: Diagnosis not present

## 2024-12-12 DIAGNOSIS — R911 Solitary pulmonary nodule: Secondary | ICD-10-CM | POA: Diagnosis not present

## 2024-12-12 DIAGNOSIS — R918 Other nonspecific abnormal finding of lung field: Secondary | ICD-10-CM

## 2024-12-12 DIAGNOSIS — R9389 Abnormal findings on diagnostic imaging of other specified body structures: Secondary | ICD-10-CM

## 2024-12-12 LAB — SEDIMENTATION RATE: Sed Rate: 16 mm/h (ref 0–30)

## 2024-12-12 NOTE — Progress Notes (Signed)
 "  History of Present Illness Rebekah Schultz is a 69 y.o. female never smoker referred to Dr. Shelah for evaluation of multiple pulmonary lung nodules and recurrent pneumonia noted on a Ct done to evaluate her kidneys 11/22/2023.    12/12/2024 Discussed the use of AI scribe software for clinical note transcription with the patient, who gave verbal consent to proceed.  Synopsis 69 year old female with recurrent pneumonia and lung nodules who presents with a persistent cough.She was referred by her primary care physician for evaluation of lung nodules found incidentally during a kidney scan.  A CT scan done 12/24 revealed lung nodules, scarring in the lung bases, and low-level paraseptal emphysema, but no interstitial lung disease.   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 Lab work revealed she has several inflammatory markers that were slightly positive. We discussed sending her for referral to rheumatology to have this further evaluated if she has continued issues with upper respiratory issues.  She also had notation of emphysema on her CT chest which is unusual in a never smoker. I have provided her with a home alpha-1 ID test to see if she is alpha-1 deficient. She has not yet done the test.   She presents today for follow up after surveillance imaging. History of Present Illness Rebekah Schultz is a 69 year old female with lung nodules and emphysema who presents for follow-up of her pulmonary condition.  She has persistent small ground glass nodules in both lungs, predominantly in the lower lobes, with a new focus of ground glass opacity in the left lower lobe. She has not fully recovered from a cold she caught after Thanksgiving, experiencing ongoing cough and drainage. She takes montelukast and Singulair for allergies and has a history of pneumonia in December 2025.  She has a history of emphysema noted on a previous  CT scan, which is unusual as she has never smoked. She was previously provided with an alpha-1 antitrypsin deficiency test kit but does not recall completing it. There is no documentation of emphysema on her most recent scan, which only notes minimal paraseptal emphysema in the upper airways from a previous scan.  She recently underwent a CT scan of the chest, which revealed a nodular density in the upper outer right breast measuring 1.7 by 1.5 cm, with a recommendation to correlate with mammography. She had a mammogram last week at [facility] but has not yet received the results. Her mother had breast cancer, which raises her concern about the finding.  She has been experiencing joint pain, including in her knees, collarbone, and hands, and has a rheumatology appointment scheduled for April. Her son-in-law, a doctor, suggested having certain blood work done before the appointment to make the visit more beneficial.  She has a family history of diabetes, with three siblings and her father affected. She has been told she was borderline diabetic in the past. She has intentionally lost 20 pounds since September by cutting meal portions and reducing bread and sweets, aiming to improve her health metrics.     Test Results: CT Chest 11/28/2024 Similar appearance of innumerable sub 5 mm ground-glass nodules throughout both lungs, predominantly within the lower lungs and perilymphatic in distribution. New focus of ground-glass opacity in the posterior left lower lobe. Findings are nonspecific and may represent superimposed infection/inflammation or atelectasis. 2. Nodular density in the upper outer right breast measures 1.7 x 1.5 cm. Recommend correlation  with mammography. 3.  Aortic Atherosclerosis (ICD10-I70.0).  05/07/2024 CT Chest without Contrast Innumerable 4 mm and smaller ground-glass micronodules in both lungs, predominating but not exclusively seen in the periphery of the lungs and with the most  numerous nodules symmetrically in the lung bases. No appreciable interval change in the number or distribution of the nodules. A chronic inflammatory or infectious process such as respiratory bronchiolitis, occupational inhalational disease or hypersensitivity pneumonitis is strongly favored although the scan is not diagnostic for any one disease process. No interval enlarged nodules are seen. Minimal apical lung paraseptal emphysema. Aortic and coronary artery atherosclerosis. Small chronic pericardial effusion. Osteopenia and degenerative change.  12/01/2023 HRCT Innumerable tiny ground-glass nodules diffusely throughout the lungs although most concentrated in the lung bases. These are nonspecific and infectious or inflammatory. Although such nodules are most commonly seen in smoking-related respiratory bronchiolitis and other inhalational inflammatory lung disease such as hypersensitivity pneumonitis or occupational pneumoconiosis, bibasilar predominant distribution is not typical. Mild, bland appearing bandlike scarring in the lung bases. No evidence of fibrotic interstitial lung disease. Suspect minimal paraseptal emphysema. Coronary artery disease.    Latest Ref Rng & Units 02/16/2024    4:15 PM  CBC  WBC 4.0 - 10.5 K/uL 5.3   Hemoglobin 12.0 - 15.0 g/dL 85.9   Hematocrit 63.9 - 46.0 % 42.2   Platelets 150.0 - 400.0 K/uL 257.0         No data to display          BNP No results found for: BNP  ProBNP No results found for: PROBNP  PFT No results found for: FEV1PRE, FEV1POST, FVCPRE, FVCPOST, TLC, DLCOUNC, PREFEV1FVCRT, PSTFEV1FVCRT  CT CHEST WO CONTRAST Result Date: 12/06/2024 CLINICAL DATA:  Follow-up pulmonary nodules EXAM: CT CHEST WITHOUT CONTRAST TECHNIQUE: Multidetector CT imaging of the chest was performed following the standard protocol without IV contrast. RADIATION DOSE REDUCTION: This exam was performed according to the departmental  dose-optimization program which includes automated exposure control, adjustment of the mA and/or kV according to patient size and/or use of iterative reconstruction technique. COMPARISON:  CT chest dated 05/07/2024, CT abdomen and pelvis dated 01/03/2024 FINDINGS: Cardiovascular: Normal heart size. No significant pericardial fluid/thickening. Great vessels are normal in course and caliber. Coronary artery calcifications and aortic atherosclerosis. Mediastinum/Nodes: Imaged thyroid  gland without nodules meeting criteria for imaging follow-up by size. Normal esophagus. No pathologically enlarged axillary, supraclavicular, mediastinal, or hilar lymph nodes. Lungs/Pleura: The central airways are patent. Again seen are innumerable sub 5 mm ground-glass nodules throughout both lungs, predominantly within the lower lungs and perilymphatic in distribution. New focus of ground-glass opacity in the posterior left lower lobe (3:95). No pneumothorax. No pleural effusion. Upper abdomen: Left upper pole renal cortical scarring. Partially imaged hypodensity in the posterior interpolar left kidney (2:174), likely corresponding to renal cyst noted on prior CT. Musculoskeletal: No acute or abnormal lytic or blastic osseous lesions. Nodular density in the upper outer right breast measures 1.7 x 1.5 cm (2:71). IMPRESSION: 1. Similar appearance of innumerable sub 5 mm ground-glass nodules throughout both lungs, predominantly within the lower lungs and perilymphatic in distribution. New focus of ground-glass opacity in the posterior left lower lobe. Findings are nonspecific and may represent superimposed infection/inflammation or atelectasis. 2. Nodular density in the upper outer right breast measures 1.7 x 1.5 cm. Recommend correlation with mammography. 3.  Aortic Atherosclerosis (ICD10-I70.0). Electronically Signed   By: Limin  Xu M.D.   On: 12/06/2024 16:54   MR Knee Left w/o contrast Result Date:  11/23/2024 CLINICAL DATA:  Ongoing  left knee pain. Recent aspiration of Baker's cyst and knee injection on 11/08/2024. EXAM: MRI OF THE LEFT KNEE WITHOUT CONTRAST TECHNIQUE: Multiplanar, multisequence MR imaging of the knee was performed. No intravenous contrast was administered. COMPARISON:  Left knee radiographs dated 11/08/2024. FINDINGS: MENISCI Medial: Intact. Lateral: Intact. LIGAMENTS Cruciates: ACL and PCL are intact. Collaterals: Medial collateral ligament is intact. Lateral collateral ligament complex is intact. CARTILAGE Patellofemoral: Mild fissuring of the medial patellar facet articular cartilage. Medial: Mild partial-thickness cartilage loss of the medial femorotibial compartment. Lateral:  No chondral defect. JOINT: Small joint effusion. Normal Hoffa's fat-pad. No plical thickening. POPLITEAL FOSSA: Popliteus tendon is intact. Inferiorly ruptured Baker's cyst measuring up to 1.7 x 1.1 cm in axial dimension and 5.6 cm in craniocaudal dimension with fluid decompressing distally along the superficial aspect of the medial head of the gastrocnemius muscle. There is intramuscular edema within the proximal visualized medial head of the gastrocnemius muscle with increased signal at the myotendinous junction, which could be secondary to recent instrumentation/aspiration and/or sprain of the medial head of the gastrocnemius muscle. EXTENSOR MECHANISM: Intact quadriceps tendon. Intact patellar tendon with mild thickening and increased signal proximally at the level of the patellar attachment, most compatible with tendinosis. Intact lateral patellar retinaculum. Intact medial patellar retinaculum. BONES: No aggressive osseous lesion. No fracture or dislocation. Other: Linear tract of subcutaneous edema extending from the skin surface within the posterolateral knee may relate to site of prior instrumentation. No loculated subcutaneous fluid collection. IMPRESSION: 1. Inferiorly ruptured Baker's cyst measuring up to 1.7 x 1.1 cm in axial dimension  and 5.6 cm in craniocaudal dimension with fluid decompressing distally along the superficial aspect of the medial head of the gastrocnemius muscle. There is intramuscular edema within the proximal visualized medial head of the gastrocnemius muscle with increased signal at the myotendinous junction, which could be secondary to recent instrumentation/aspiration and/or sprain of the medial head of the gastrocnemius muscle. 2. Mild patellar tendinosis. 3. Linear tract of subcutaneous edema extending from the skin surface within the posterolateral knee may relate to site of prior instrumentation. No loculated subcutaneous fluid collection. 4. Mild fissuring of the medial patellar facet articular cartilage. 5. Mild partial-thickness cartilage loss of the medial femorotibial compartment. 6. Small knee joint effusion. Electronically Signed   By: Harrietta Sherry M.D.   On: 11/23/2024 11:36     Past medical hx Past Medical History:  Diagnosis Date   Back pain    Hyperlipidemia    Lung nodules    Varicose veins      Social History[1]  Ms.Shupert reports that she has never smoked. She has been exposed to tobacco smoke. She does not have any smokeless tobacco history on file. She reports current alcohol  use. She reports that she does not use drugs.  Tobacco Cessation: Counseling given: Not Answered Never smoker   Past surgical hx, Family hx, Social hx all reviewed.  Current Outpatient Medications on File Prior to Visit  Medication Sig   albuterol (VENTOLIN HFA) 108 (90 Base) MCG/ACT inhaler Inhale 1-2 puffs into the lungs every 6 (six) hours as needed for wheezing or shortness of breath.   Calcium Carb-Cholecalciferol (CALCIUM + D3) 600-200 MG-UNIT TABS Take by mouth daily.   fluticasone (FLONASE) 50 MCG/ACT nasal spray Place 1 spray into both nostrils daily.   levocetirizine (XYZAL) 5 MG tablet Take 5 mg by mouth every evening.   montelukast (SINGULAIR) 10 MG tablet Take 10 mg by mouth at bedtime.  Multiple Vitamin (MULTIVITAMIN) tablet Take 1 tablet by mouth daily.   naproxen sodium (ANAPROX) 220 MG tablet Take 220 mg by mouth daily as needed.   pravastatin  (PRAVACHOL ) 40 MG tablet Take 1 tablet (40 mg total) by mouth daily.   zoledronic  acid (RECLAST ) 5 MG/100ML SOLN injection as directed Intravenous   No current facility-administered medications on file prior to visit.     Allergies[2]  Review Of Systems:  Constitutional:   No  weight loss, night sweats,  Fevers, chills, fatigue, or  lassitude.  HEENT:   No headaches,  Difficulty swallowing,  Tooth/dental problems, or  Sore throat,                No sneezing, itching, ear ache, nasal congestion, post nasal drip,   CV:  No chest pain,  Orthopnea, PND, swelling in lower extremities, anasarca, dizziness, palpitations, syncope.   GI  No heartburn, indigestion, abdominal pain, nausea, vomiting, diarrhea, change in bowel habits, loss of appetite, bloody stools.   Resp: No shortness of breath with exertion or at rest.  No excess mucus, no productive cough,  No non-productive cough,  No coughing up of blood.  No change in color of mucus.  No wheezing.  No chest wall deformity  Skin: no rash or lesions.  GU: no dysuria, change in color of urine, no urgency or frequency.  No flank pain, no hematuria   MS:  No joint pain or swelling.  No decreased range of motion.  No back pain.  Psych:  No change in mood or affect. No depression or anxiety.  No memory loss.   Vital Signs BP 105/72   Pulse 61   Temp 97.7 F (36.5 C) (Oral)   Ht 5' 6.5 (1.689 m)   Wt 142 lb (64.4 kg)   SpO2 99%   BMI 22.58 kg/m    Physical Exam: Physical Exam MEASUREMENTS: Weight- 142, BMI- 22.5. GENERAL: No distress, alert and oriented times 3. EARS NOSE THROAT: No sinus tenderness, tympanic membranes clear, pale nasal mucosa, no oral exudate, no post nasal drip, no lymphadenopathy. CHEST: Lungs clear to auscultation. No wheeze, rales, dullness, no  accessory muscle use, no nasal flaring, no sternal retractions. CARDIAC: S1, S2, regular rate and rhythm, no murmur. ABDOMINAL: Soft, non tender. ND, BS present, EXTREMITIES: No clubbing, cyanosis, edema. No obvious deformities NEUROLOGICAL: Normal strength. Alert and oriented x 3, MAE x 4 SKIN: No rashes, warm and dry. No obvious skin lesions PSYCHIATRIC: Normal mood and behavior.   Assessment/Plan  Assessment and Plan Assessment & Plan Multiple lung nodules Small ground glass nodules in both lungs, stable, with new left lower lobe opacity likely due to infection or inflammation. No immediate malignancy concern. Differential includes autoimmune-related nodules. - Ordered follow-up CT chest in six months to monitor lung nodules. - Advised to call if symptomatic to adjust timing of CT scan.  Centrilobular emphysema Previous CT indicated emphysema, not present on current scan, suggesting resolution or misinterpretation. Alpha-1 antitrypsin deficiency considered. - Ordered alpha-1 antitrypsin level to evaluate for deficiency. - Ordered basic autoimmune panel for rheumatology appointment in April.  Nodular density in the upper outer right breast measures 1.7 x 1.5 cm. Recommend correlation with mammography. Plan Mammogram done this month, patient does not have the results.  Follow up with PCP about results of mammogram   I spent 30 minutes dedicated to the care of this patient on the date of this encounter to include pre-visit review of records, face-to-face time with the patient  discussing conditions above, post visit ordering of testing, clinical documentation with the electronic health record, making appropriate referrals as documented, and communicating necessary information to the patient's healthcare team.        Lauraine JULIANNA Lites, NP 12/12/2024  9:12 AM             [1]  Social History Tobacco Use   Smoking status: Never    Passive exposure: Past  Substance Use Topics    Alcohol  use: Yes    Comment: rare   Drug use: No  [2]  Allergies Allergen Reactions   Alendronate     Other Reaction(s): bone pain   Codeine     Severe nausea   "

## 2024-12-12 NOTE — Patient Instructions (Signed)
 It is good to see you today. We have looked at your CT chest. The lung nodules we have been watching are stable.  We will do a 6 month follow up Ct chest. This will be due 05/2025. You will follow up with me after the visit.  Remember to follow up on your mammogram. We will do an alpha 1 level today. This is to evaluate the finding of emphysema on your CT chest.  We just want to ensure your level falls within the normal range. We will draw a panel of auto immune labs so they are done when you go for your rheumatology appointment. We will call you with the results. Call if you need us  sooner. Please contact office for sooner follow up if symptoms do not improve or worsen or seek emergency care

## 2024-12-19 LAB — CYCLIC CITRUL PEPTIDE ANTIBODY, IGG: Cyclic Citrullin Peptide Ab: <16 U

## 2024-12-19 LAB — ALPHA-1 ANTITRYPSIN PHENOTYPE: A-1 Antitrypsin, Ser: 191 mg/dL (ref 83–199)

## 2024-12-19 LAB — ANA: Anti Nuclear Antibody (ANA): NEGATIVE

## 2024-12-19 LAB — SJOGRENS SYNDROME-A EXTRACTABLE NUCLEAR ANTIBODY: SSA (Ro) (ENA) Antibody, IgG: <1 AI

## 2024-12-19 LAB — SJOGRENS SYNDROME-B EXTRACTABLE NUCLEAR ANTIBODY: SSB (La) (ENA) Antibody, IgG: <1 AI

## 2024-12-19 LAB — RHEUMATOID FACTOR: Rheumatoid fact SerPl-aCnc: 10 [IU]/mL

## 2024-12-19 LAB — ANTI-DNA ANTIBODY, DOUBLE-STRANDED: ds DNA Ab: <1 [IU]/mL

## 2025-03-01 ENCOUNTER — Ambulatory Visit: Admitting: Internal Medicine
